# Patient Record
Sex: Female | Born: 1960
Health system: Southern US, Community
[De-identification: ages and names within clinical notes are randomized; demographics above are authoritative.]

## PROBLEM LIST (undated history)

## (undated) DIAGNOSIS — J449 Chronic obstructive pulmonary disease, unspecified: Secondary | ICD-10-CM

---

## 2021-06-23 ENCOUNTER — Inpatient Hospital Stay
Admission: EM | Admit: 2021-06-23 | Discharge: 2021-06-28 | DRG: 190 | Disposition: A | Discharge status: Home / Self Care | Payer: BC Managed Care – PPO | Attending: Internal Medicine | Admitting: Internal Medicine

## 2021-06-23 ENCOUNTER — Emergency Department: Payer: BC Managed Care – PPO

## 2021-06-23 ENCOUNTER — Other Ambulatory Visit: Payer: Self-pay

## 2021-06-23 DIAGNOSIS — Z20828 Contact with and (suspected) exposure to other viral communicable diseases: Secondary | ICD-10-CM | POA: Diagnosis present

## 2021-06-23 DIAGNOSIS — R739 Hyperglycemia, unspecified: Secondary | ICD-10-CM | POA: Diagnosis present

## 2021-06-23 DIAGNOSIS — J440 Chronic obstructive pulmonary disease with acute lower respiratory infection: Secondary | ICD-10-CM | POA: Diagnosis not present

## 2021-06-23 DIAGNOSIS — R778 Other specified abnormalities of plasma proteins: Secondary | ICD-10-CM | POA: Diagnosis present

## 2021-06-23 DIAGNOSIS — I248 Other forms of acute ischemic heart disease: Secondary | ICD-10-CM | POA: Diagnosis present

## 2021-06-23 DIAGNOSIS — J9601 Acute respiratory failure with hypoxia: Secondary | ICD-10-CM | POA: Diagnosis present

## 2021-06-23 DIAGNOSIS — Z87891 Personal history of nicotine dependence: Secondary | ICD-10-CM | POA: Diagnosis not present

## 2021-06-23 DIAGNOSIS — J439 Emphysema, unspecified: Principal | ICD-10-CM | POA: Diagnosis present

## 2021-06-23 DIAGNOSIS — Z20822 Contact with and (suspected) exposure to covid-19: Secondary | ICD-10-CM | POA: Diagnosis present

## 2021-06-23 DIAGNOSIS — R7301 Impaired fasting glucose: Secondary | ICD-10-CM

## 2021-06-23 DIAGNOSIS — J209 Acute bronchitis, unspecified: Secondary | ICD-10-CM | POA: Diagnosis present

## 2021-06-23 DIAGNOSIS — E785 Hyperlipidemia, unspecified: Secondary | ICD-10-CM | POA: Diagnosis present

## 2021-06-23 DIAGNOSIS — J9621 Acute and chronic respiratory failure with hypoxia: Secondary | ICD-10-CM | POA: Diagnosis present

## 2021-06-23 DIAGNOSIS — R7989 Other specified abnormal findings of blood chemistry: Secondary | ICD-10-CM | POA: Diagnosis present

## 2021-06-23 HISTORY — DX: Chronic obstructive pulmonary disease, unspecified: J44.9

## 2021-06-23 LAB — COMPREHENSIVE METABOLIC PANEL
ALT: 21 U/L (ref 0–44)
AST: 28 U/L (ref 15–41)
Albumin: 4.2 g/dL (ref 3.5–5.0)
Alkaline Phosphatase: 64 U/L (ref 38–126)
Anion gap: 10 (ref 5–15)
BUN: 14 mg/dL (ref 6–20)
CO2: 22 mmol/L (ref 22–32)
Calcium: 8.7 mg/dL — ABNORMAL LOW (ref 8.9–10.3)
Chloride: 106 mmol/L (ref 98–111)
Creatinine, Ser: 0.79 mg/dL (ref 0.44–1.00)
GFR, Estimated: >60 mL/min (ref >60)
Glucose, Bld: 246 mg/dL — ABNORMAL HIGH (ref 70–99)
Potassium: 4 mmol/L (ref 3.5–5.1)
Sodium: 138 mmol/L (ref 135–145)
Total Bilirubin: 0.7 mg/dL (ref 0.3–1.2)
Total Protein: 7.9 g/dL (ref 6.5–8.1)

## 2021-06-23 LAB — CBC WITH DIFFERENTIAL/PLATELET
Abs Immature Granulocytes: 0.05 10*3/uL (ref 0.00–0.07)
Basophils Absolute: 0.1 10*3/uL (ref 0.0–0.1)
Basophils Relative: 1 %
Eosinophils Absolute: 0.1 10*3/uL (ref 0.0–0.5)
Eosinophils Relative: 0 %
HCT: 43.6 % (ref 36.0–46.0)
Hemoglobin: 14.4 g/dL (ref 12.0–15.0)
Immature Granulocytes: 0 %
Lymphocytes Relative: 17 %
Lymphs Abs: 2.6 10*3/uL (ref 0.7–4.0)
MCH: 28.9 pg (ref 26.0–34.0)
MCHC: 33 g/dL (ref 30.0–36.0)
MCV: 87.6 fL (ref 80.0–100.0)
Monocytes Absolute: 0.9 10*3/uL (ref 0.1–1.0)
Monocytes Relative: 6 %
Neutro Abs: 11.7 10*3/uL — ABNORMAL HIGH (ref 1.7–7.7)
Neutrophils Relative %: 76 %
Platelets: 214 10*3/uL (ref 150–400)
RBC: 4.98 MIL/uL (ref 3.87–5.11)
RDW: 12.6 % (ref 11.5–15.5)
WBC: 15.3 10*3/uL — ABNORMAL HIGH (ref 4.0–10.5)
nRBC: 0 % (ref 0.0–0.2)

## 2021-06-23 LAB — RESP PANEL BY RT-PCR (FLU A&B, COVID) ARPGX2
Influenza A by PCR: NEGATIVE
Influenza B by PCR: NEGATIVE
SARS Coronavirus 2 by RT PCR: NEGATIVE

## 2021-06-23 LAB — TROPONIN I (HIGH SENSITIVITY)
Troponin I (High Sensitivity): 20 ng/L — ABNORMAL HIGH (ref <18)
Troponin I (High Sensitivity): 32 ng/L — ABNORMAL HIGH (ref <18)

## 2021-06-23 LAB — PROCALCITONIN: Procalcitonin: <0.1 ng/mL

## 2021-06-23 LAB — BLOOD GAS, VENOUS
Acid-base deficit: 3.2 mmol/L — ABNORMAL HIGH (ref 0.0–2.0)
Bicarbonate: 21.3 mmol/L (ref 20.0–28.0)
O2 Saturation: 96.3 %
Patient temperature: 37
pCO2, Ven: 36 mmHg — ABNORMAL LOW (ref 44.0–60.0)
pH, Ven: 7.38 (ref 7.250–7.430)
pO2, Ven: 86 mmHg — ABNORMAL HIGH (ref 32.0–45.0)

## 2021-06-23 LAB — LACTIC ACID, PLASMA: Lactic Acid, Venous: 1.7 mmol/L (ref 0.5–1.9)

## 2021-06-23 LAB — D-DIMER, QUANTITATIVE: D-Dimer, Quant: 1.18 ug/mL-FEU — ABNORMAL HIGH (ref 0.00–0.50)

## 2021-06-23 IMAGING — DX DG CHEST 1V PORT
1 series · 1 of 1 positions shown · non-contrast
Comparison: None.

CLINICAL DATA: Rule out pneumonia. Shortness of breath. Positive
flu test.

EXAM:
PORTABLE CHEST 1 VIEW

[chest ap]
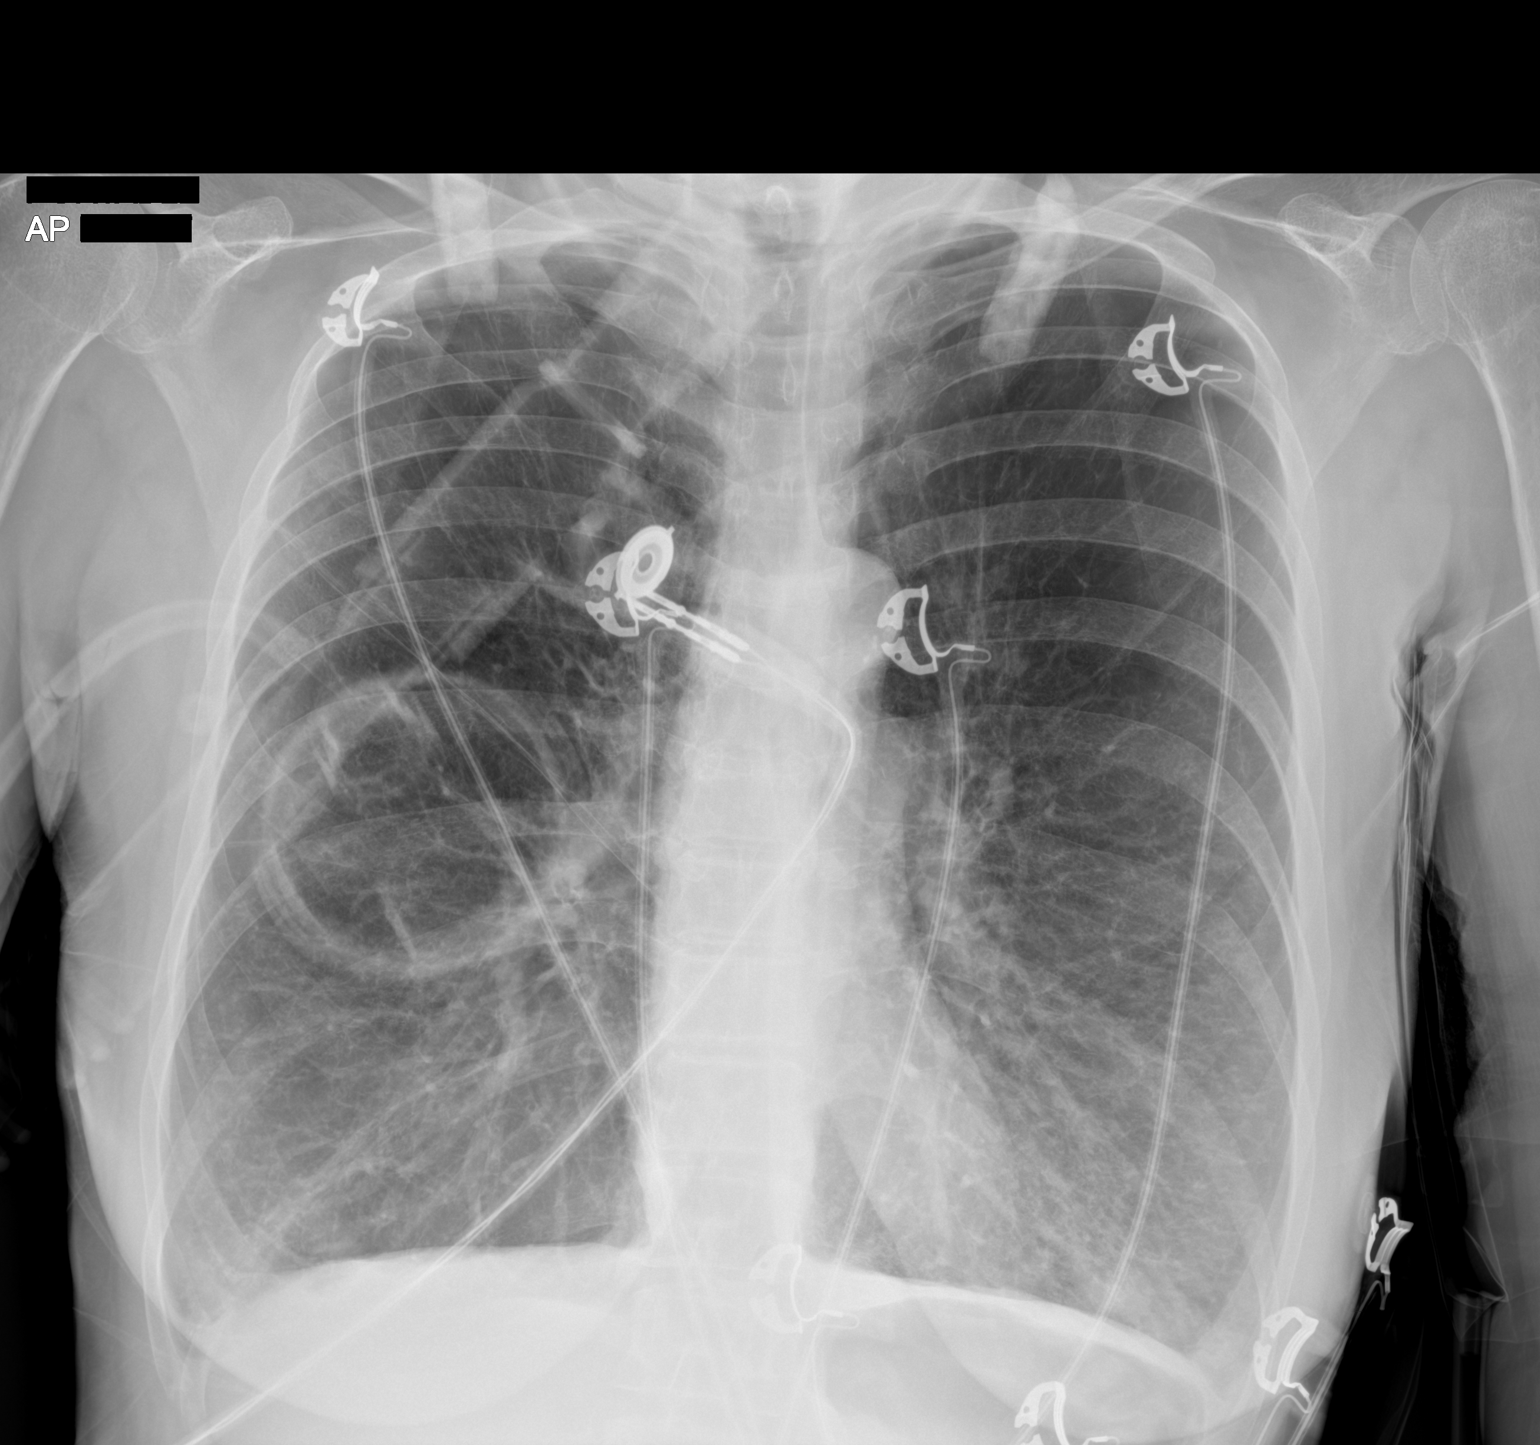

[1 of 1 positions shown; findings below may reference images not displayed]

FINDINGS: Emphysematous changes in the lungs with hyperinflation. Mild opacity
in the bases, left greater than right. No nodule or mass. The
cardiomediastinal silhouette is normal.
IMPRESSION: 1. Emphysema.
2. Bibasilar opacities are partially explained by redistribution of
vasculature. However, mild infiltrate is suspected in the left
greater than right base. Recommend short-term follow-up to ensure
resolution.

## 2021-06-23 MED ORDER — IPRATROPIUM-ALBUTEROL 0.5-2.5 (3) MG/3ML IN SOLN
3.0000 mL | Freq: Four times a day (QID) | RESPIRATORY_TRACT | Status: DC
  Administered 2021-06-24 – 2021-06-28 (×17): 3 mL via RESPIRATORY_TRACT
  Filled 2021-06-23 (×18): qty 3

## 2021-06-23 MED ORDER — SODIUM CHLORIDE 0.9 % IV SOLN
1.0000 g | Freq: Once | INTRAVENOUS | Status: AC
  Administered 2021-06-23: 1 g via INTRAVENOUS
  Filled 2021-06-23: qty 10

## 2021-06-23 MED ORDER — ENOXAPARIN SODIUM 40 MG/0.4ML IJ SOSY: 40.0000 mg | PREFILLED_SYRINGE | INTRAMUSCULAR | Status: DC

## 2021-06-23 MED ORDER — ALBUTEROL SULFATE (2.5 MG/3ML) 0.083% IN NEBU
2.5000 mg | INHALATION_SOLUTION | Freq: Once | RESPIRATORY_TRACT | Status: AC
  Administered 2021-06-23: 2.5 mg via RESPIRATORY_TRACT
  Filled 2021-06-23: qty 3

## 2021-06-23 MED ORDER — METHYLPREDNISOLONE SODIUM SUCC 40 MG IJ SOLR
40.0000 mg | Freq: Two times a day (BID) | INTRAMUSCULAR | Status: DC
  Administered 2021-06-24: 40 mg via INTRAVENOUS
  Filled 2021-06-23: qty 1

## 2021-06-23 MED ORDER — ALBUTEROL SULFATE (2.5 MG/3ML) 0.083% IN NEBU
2.5000 mg | INHALATION_SOLUTION | RESPIRATORY_TRACT | Status: DC | PRN
  Administered 2021-06-25: 2.5 mg via RESPIRATORY_TRACT
  Filled 2021-06-23: qty 3

## 2021-06-23 MED ORDER — SODIUM CHLORIDE 0.9 % IV SOLN: 500.0000 mg | INTRAVENOUS | Status: DC

## 2021-06-23 MED ORDER — SODIUM CHLORIDE 0.9 % IV BOLUS
1000.0000 mL | Freq: Once | INTRAVENOUS | Status: AC
  Administered 2021-06-23: 1000 mL via INTRAVENOUS

## 2021-06-23 MED ORDER — SODIUM CHLORIDE 0.9 % IV SOLN
500.0000 mg | Freq: Once | INTRAVENOUS | Status: AC
  Administered 2021-06-23: 500 mg via INTRAVENOUS
  Filled 2021-06-23: qty 500

## 2021-06-23 MED ORDER — PREDNISONE 20 MG PO TABS: 40.0000 mg | ORAL_TABLET | Freq: Every day | ORAL | Status: DC

## 2021-06-23 MED ORDER — ALBUTEROL SULFATE (2.5 MG/3ML) 0.083% IN NEBU
2.5000 mg | INHALATION_SOLUTION | Freq: Once | RESPIRATORY_TRACT | Status: AC
  Administered 2021-06-24: 2.5 mg via RESPIRATORY_TRACT
  Filled 2021-06-23: qty 3

## 2021-06-23 NOTE — H&P (Addendum)
History and Physical    Gina Bates KGM:[id number] DOB: 10/29/1960 DOA: 06/23/2021  PCP: Laqueta Carina, MD   Patient coming from: home  I have personally briefly reviewed patient's relevant medical records in [facility]  Chief Complaint: shortness of breath  HPI: Gina Bates is a 60 y.o. female with medical history significant for COPD, not on home oxygen with flulike symptoms x4 days with cough productive of yellow sputum and subjective fever, following exposure to grandson who is flu positive who presents to the ED with sudden onset shortness of breath that started 7 hours prior to presentation.  Patient had been taking Tamiflu for the past 4 days prescribed by her PCP based on his symptoms and her grandsons positive influenza test.  She denies nausea, vomiting or chills.  Denies lower extremity pain or swelling and denies chest pain.  EMS reported O2 sats in the 80s on room air and she was transported on CPAP.  She received duo nebs and Solu-Medrol in route.  ED course: On arrival, tachypneic at 36 and tachycardic at 126.  She was transitioned from CPAP to BiPAP and eventually to HFNC at 5 L saturating in the low low to mid 90s Blood work: VBG with pH 7.38, PCO2 36 WBC 15,000, lactic acid 1.7, procalcitonin less than 0.1 Troponin 20-32.  D-dimer 1.18.  Blood sugar 246.    EKG, personally viewed and interpreted: Sinus tachycardia at 136.  Nonspecific ST-T wave changes  Chest x-ray: Emphysema: Mild infiltrate suspected in left greater than right base  CTA chest: Pending  Patient treated with Rocephin and vancomycin.  Hospitalist consulted for admission    Review of Systems: As per HPI otherwise all other systems on review of systems negative.   Past Medical History:  Diagnosis Date   COPD (chronic obstructive pulmonary disease) (HCC)     History reviewed. No pertinent surgical history.   reports that she has quit smoking. Her smoking use included cigarettes.  She has never used smokeless tobacco. She reports that she does not currently use alcohol. No history on file for drug use.  No Known Allergies  History reviewed. No pertinent family history.    Prior to Admission medications   Not on File    Physical Exam: Vitals:   06/23/21 1752 06/23/21 1754 06/23/21 2047  BP: (!) 119/93  130/90  Pulse: (!) 136 (!) 136 (!) 120  Resp: (!) 26 (!) 36 (!) 31  Temp: (!) 97.3 F (36.3 C)    TempSrc: Axillary    SpO2: 96% 95% 95%   Constitutional: Alert and oriented x 3 .  In moderate respiratory distress on BiPAP.  Tripoding.  Speaking in short sentences HEENT:      Head: Normocephalic and atraumatic.         Eyes: PERLA, EOMI, Conjunctivae are normal. Sclera is non-icteric.       Mouth/Throat: Mucous membranes are moist.       Neck: Supple with no signs of meningismus. Cardiovascular: Regular rate and rhythm. No murmurs, gallops, or rubs. 2+ symmetrical distal pulses are present . No JVD. No  LE edema Respiratory: Respiratory effort increased.Lungs sounds diminished bilaterally. No wheezes, crackles, or rhonchi.  Gastrointestinal: Soft, non tender, non distended. Positive bowel sounds.  Genitourinary: No CVA tenderness. Musculoskeletal: Nontender with normal range of motion in all extremities. No cyanosis, or erythema of extremities. Neurologic:  Face is symmetric. Moving all extremities. No gross focal neurologic deficits . Skin: Skin is warm, dry.  No rash  or ulcers Psychiatric: Mood and affect are appropriate    Labs on Admission: I have personally reviewed following labs and imaging studies  CBC: Recent Labs  Lab 06/23/21 1758  WBC 15.3*  NEUTROABS 11.7*  HGB 14.4  HCT 43.6  MCV 87.6  PLT 214   Basic Metabolic Panel: Recent Labs  Lab 06/23/21 1758  NA 138  K 4.0  CL 106  CO2 22  GLUCOSE 246*  BUN 14  CREATININE 0.79  CALCIUM 8.7*   GFR: CrCl cannot be calculated (Unknown ideal weight.). Liver Function Tests: Recent  Labs  Lab 06/23/21 1758  AST 28  ALT 21  ALKPHOS 64  BILITOT 0.7  PROT 7.9  ALBUMIN 4.2   No results for input(s): LIPASE, AMYLASE in the last 168 hours. No results for input(s): AMMONIA in the last 168 hours. Coagulation Profile: No results for input(s): INR, PROTIME in the last 168 hours. Cardiac Enzymes: No results for input(s): CKTOTAL, CKMB, CKMBINDEX, TROPONINI in the last 168 hours. BNP (last 3 results) No results for input(s): PROBNP in the last 8760 hours. HbA1C: No results for input(s): HGBA1C in the last 72 hours. CBG: No results for input(s): GLUCAP in the last 168 hours. Lipid Profile: No results for input(s): CHOL, HDL, LDLCALC, TRIG, CHOLHDL, LDLDIRECT in the last 72 hours. Thyroid Function Tests: No results for input(s): TSH, T4TOTAL, FREET4, T3FREE, THYROIDAB in the last 72 hours. Anemia Panel: No results for input(s): VITAMINB12, FOLATE, FERRITIN, TIBC, IRON, RETICCTPCT in the last 72 hours. Urine analysis: No results found for: COLORURINE, APPEARANCEUR, LABSPEC, PHURINE, GLUCOSEU, HGBUR, BILIRUBINUR, KETONESUR, PROTEINUR, UROBILINOGEN, NITRITE, LEUKOCYTESUR  Radiological Exams on Admission: DG Chest Portable 1 View  Result Date: 06/23/2021 CLINICAL DATA:  Rule out pneumonia. Shortness of breath. Positive flu test. EXAM: PORTABLE CHEST 1 VIEW COMPARISON:  None. FINDINGS: Emphysematous changes in the lungs with hyperinflation. Mild opacity in the bases, left greater than right. No nodule or mass. The cardiomediastinal silhouette is normal. IMPRESSION: 1. Emphysema. 2. Bibasilar opacities are partially explained by redistribution of vasculature. However, mild infiltrate is suspected in the left greater than right base. Recommend short-term follow-up to ensure resolution. Electronically Signed   By: Gerome Sam III M.D.   On: 06/23/2021 18:20    Assessment/Plan    Acute respiratory failure with hypoxia  -Patient in moderate respiratory distress, tachypneic,  tripoding, speaking in 2 word sentences, requiring BiPAP - Secondary to viral respiratory tract infection, acute on chronic bronchitis, possible PE given elevated D-dimer, - Continue BiPAP - Follow-up CTA chest (patient unable to tolerate lying flat so we will give one-time treatment dose of Lovenox until able to get CTA chest) - Treat potential etiologies    COPD with acute bronchitis (HCC) - Continue azithromycin - Ativan as needed-bronchodilators - Continue IV steroids  Viral respiratory tract infection/? - COVID and flu negative - We will continue Tamiflu to completion given history of exposure -droplet precautions    Elevated troponin - Suspect demand ischemia with troponin 20-32, without chest pain or ischemic EKG changes    Hyperglycemia - Blood glucose 246 without history of diabetes - Hemoglobin A1c blood sugar monitoring given IV steroids    DVT prophylaxis: Lovenox  Code Status: full code  Family Communication:  son at bedside Disposition Plan: Back to previous home environment Consults called: none  Status:At the time of admission, it appears that the appropriate admission status for this patient is INPATIENT. This is judged to be reasonable and necessary in order to provide  the required intensity of service to ensure the patient's safety given the presenting symptoms, physical exam findings, and initial radiographic and laboratory data in the context of their  Comorbid conditions.   Patient requires inpatient status due to high intensity of service, high risk for further deterioration and high frequency of surveillance required.   I certify that at the point of admission it is my clinical judgment that the patient will require inpatient hospital care spanning beyond 2 midnights    Andris Baumann MD [facility]   06/23/2021, 10:43 PM

## 2021-06-23 NOTE — ED Notes (Signed)
Report given to Melissa RN

## 2021-06-23 NOTE — ED Provider Notes (Addendum)
[facility]  ____________________________________________   Event Date/Time   First MD Initiated Contact with Patient 06/23/21 1754     (approximate)  I have reviewed the triage vital signs and the nursing notes.   HISTORY  Chief Complaint Shortness of Breath and Influenza    HPI Gina Bates is a 60 y.o. female with past medical history of COPD not on home oxygen and hyperlipidemia who presents with shortness of breath.  Patient tells me that about 4 days ago she started having flulike symptoms including cough productive of yellow sputum subjective fevers.  Then today her breathing started to get bad.  Her grandson had influenza and she had a telehealth visit and was prescribed Tamiflu but did not actually have a positive flu test.  Patient denies chest pain nausea vomiting fevers or chills.  No history of blood clots.  Denies lower extremity edema.  Has never been on BiPAP before.         Past Medical History:  Diagnosis Date   COPD (chronic obstructive pulmonary disease) [facility])     Patient Active Problem List   Diagnosis Date Noted   Acute respiratory failure with hypoxia (HCC) 06/23/2021   COPD with acute bronchitis (HCC) 06/23/2021   Hyperglycemia 06/23/2021   Elevated troponin 06/23/2021    Prior to Admission medications   Not on File    Allergies Patient has no allergy information on record.  History reviewed. No pertinent family history.  Social History Social History   Tobacco Use   Smoking status: Former    Types: Cigarettes   Smokeless tobacco: Never  Substance Use Topics   Alcohol use: Not Currently    Review of Systems   Review of Systems  Constitutional:  Positive for fever. Negative for chills.  Respiratory:  Positive for shortness of breath.   Cardiovascular:  Negative for chest pain and leg swelling.  Gastrointestinal:  Negative for abdominal pain, nausea and vomiting.  All other systems reviewed and are  negative.  Physical Exam Updated Vital Signs BP 130/90 (BP Location: Right Arm)   Pulse (!) 120   Temp (!) 97.3 F (36.3 C) (Axillary)   Resp (!) 31   SpO2 95%   Physical Exam Vitals and nursing note reviewed.  Constitutional:      General: She is in acute distress.     Appearance: Normal appearance.  HENT:     Head: Normocephalic and atraumatic.  Eyes:     General: No scleral icterus.    Conjunctiva/sclera: Conjunctivae normal.  Cardiovascular:     Rate and Rhythm: Regular rhythm. Tachycardia present.  Pulmonary:     Effort: Respiratory distress present.     Breath sounds: No stridor.     Comments: Patient has respiratory distress, using accessory muscles, decreased air movement throughout no obvious wheezing Is able to speak in short sentences Abdominal:     Palpations: Abdomen is soft.     Tenderness: There is no abdominal tenderness. There is no guarding.  Musculoskeletal:        General: No deformity or signs of injury.     Cervical back: Normal range of motion.     Right lower leg: No edema.     Left lower leg: No edema.  Skin:    General: Skin is dry.     Coloration: Skin is not jaundiced or pale.  Neurological:     General: No focal deficit present.     Mental Status: She is alert and oriented  to person, place, and time. Mental status is at baseline.  Psychiatric:        Mood and Affect: Mood normal.        Behavior: Behavior normal.     LABS (all labs ordered are listed, but only abnormal results are displayed)  Labs Reviewed  COMPREHENSIVE METABOLIC PANEL - Abnormal; Notable for the following components:      Result Value   Glucose, Bld 246 (*)    Calcium 8.7 (*)    All other components within normal limits  CBC WITH DIFFERENTIAL/PLATELET - Abnormal; Notable for the following components:   WBC 15.3 (*)    Neutro Abs 11.7 (*)    All other components within normal limits  BLOOD GAS, VENOUS - Abnormal; Notable for the following components:   pCO2,  Ven 36 (*)    pO2, Ven 86.0 (*)    Acid-base deficit 3.2 (*)    All other components within normal limits  D-DIMER, QUANTITATIVE - Abnormal; Notable for the following components:   D-Dimer, Quant 1.18 (*)    All other components within normal limits  TROPONIN I (HIGH SENSITIVITY) - Abnormal; Notable for the following components:   Troponin I (High Sensitivity) 20 (*)    All other components within normal limits  TROPONIN I (HIGH SENSITIVITY) - Abnormal; Notable for the following components:   Troponin I (High Sensitivity) 32 (*)    All other components within normal limits  RESP PANEL BY RT-PCR (FLU A&B, COVID) ARPGX2  CULTURE, BLOOD (ROUTINE X 2)  CULTURE, BLOOD (ROUTINE X 2)  LACTIC ACID, PLASMA  PROCALCITONIN  LACTIC ACID, PLASMA  HIV ANTIBODY (ROUTINE TESTING W REFLEX)  CBC  CREATININE, SERUM  HEMOGLOBIN A1C   ____________________________________________  EKG  Sinus tachycardia, borderline right axis deviation normal intervals, no acute ischemic changes ____________________________________________  RADIOLOGY Ky Barban, personally viewed and evaluated these images (plain radiographs) as part of my medical decision making, as well as reviewing the written report by the radiologist.  ED MD interpretation: The chest x-ray which shows patchy interstitial markings throughout the bilateral lower lobes    ____________________________________________   PROCEDURES  Procedure(s) performed (including Critical Care):  .Critical Care Performed by: Georga Hacking, MD Authorized by: Georga Hacking, MD   Critical care provider statement:    Critical care time (minutes):  75   Critical care was time spent personally by me on the following activities:  Development of treatment plan with patient or surrogate, discussions with consultants, evaluation of patient's response to treatment, examination of patient, ordering and review of laboratory studies, ordering and  review of radiographic studies, ordering and performing treatments and interventions, pulse oximetry, re-evaluation of patient's condition and review of old charts   ____________________________________________   INITIAL IMPRESSION / ASSESSMENT AND PLAN / ED COURSE   Patient is a 60 year old female with COPD and recent viral syndrome who presents with shortness of breath.  Per EMS she was initially hypoxic in the 80s and was placed on CPAP.  On ED arrival she is transition to BiPAP immediately.  Saturating in the mid 90s and tachycardic.  She does have significant increased work of breathing, she does not have obvious wheezing but just poor air movement.  She was given Solu-Medrol with EMS, will continue to treat with albuterol nebs.  Patient's chest x-ray does show some increased interstitial markings, radiology does comment that there may be a left lower lobe infiltrate.  She does have a leukocytosis of 15 with  11.7% neutrophils.  With her clinical symptoms we will treat her.  We will also send Procal lactate and blood cultures.  Patient's VBG is rather normal, PCO2 if anything is decreased which is not really consistent with significant COPD exacerbation.  On repeat assessment patient's work of breathing is improved and she feels better however she is still only saturating 93% on the BiPAP.  Together with her overall reassuring VBG I am concerned that her hypoxia and tachycardia somewhat out of proportion to her COPD.  We will obtain a D-dimer, positive pursue CT angio.  Patient will require admission given her new oxygen requirement.   D-dimer is positive.  CT angio was ordered.  Discussed the patient with the hospitalist who admit her.  Of note patient was transferred to high flow and was doing well however started to have increased work of breathing so was put back on BiPAP.  During this time oxygenation remained normal.  She again does not have any  wheezing.  ____________________________________________   FINAL CLINICAL IMPRESSION(S) / ED DIAGNOSES  Final diagnoses:  Acute respiratory failure with hypoxia [facility])     ED Discharge Orders     None        Note:  This document was prepared using Dragon voice recognition software and may include unintentional dictation errors.    Georga Hacking, MD 06/23/21 2337    Georga Hacking, MD 06/24/21 [phone]

## 2021-06-23 NOTE — ED Triage Notes (Signed)
Pt comes via EMs with c/o SOB and Flu+. EMS states this started yesterday. EMS reports pt was in 80s for O2 upon their arrival.  Pt has hx of COPD. Pt given 125 solu medrol, 1 duoneb and 2 albuterol.  Pt arrives in tripod position with cpap on. MD at bedside. 20 left hand

## 2021-06-23 NOTE — ED Notes (Signed)
Patient report some relief of shortness of breath with first neb treatment.

## 2021-06-24 ENCOUNTER — Encounter: Payer: Self-pay | Admitting: Internal Medicine

## 2021-06-24 DIAGNOSIS — J9601 Acute respiratory failure with hypoxia: Secondary | ICD-10-CM | POA: Diagnosis not present

## 2021-06-24 LAB — HIV ANTIBODY (ROUTINE TESTING W REFLEX): HIV Screen 4th Generation wRfx: NONREACTIVE

## 2021-06-24 LAB — HEMOGLOBIN A1C
Hgb A1c MFr Bld: 5.8 % — ABNORMAL HIGH (ref 4.8–5.6)
Mean Plasma Glucose: 120 mg/dL

## 2021-06-24 LAB — LACTIC ACID, PLASMA: Lactic Acid, Venous: 1.3 mmol/L (ref 0.5–1.9)

## 2021-06-24 MED ORDER — OSELTAMIVIR PHOSPHATE 75 MG PO CAPS
75.0000 mg | ORAL_CAPSULE | Freq: Two times a day (BID) | ORAL | Status: AC
  Administered 2021-06-24 (×2): 75 mg via ORAL
  Filled 2021-06-24 (×3): qty 1

## 2021-06-24 MED ORDER — AZITHROMYCIN 250 MG PO TABS
500.0000 mg | ORAL_TABLET | Freq: Every day | ORAL | Status: AC
  Administered 2021-06-24 – 2021-06-27 (×4): 500 mg via ORAL
  Filled 2021-06-24 (×2): qty 2
  Filled 2021-06-24: qty 1
  Filled 2021-06-24: qty 2

## 2021-06-24 MED ORDER — ENOXAPARIN SODIUM 30 MG/0.3ML IJ SOSY
30.0000 mg | PREFILLED_SYRINGE | INTRAMUSCULAR | Status: DC
  Administered 2021-06-24 – 2021-06-25 (×2): 30 mg via SUBCUTANEOUS
  Filled 2021-06-24 (×2): qty 0.3

## 2021-06-24 MED ORDER — ENOXAPARIN SODIUM 60 MG/0.6ML IJ SOSY
1.0000 mg/kg | PREFILLED_SYRINGE | Freq: Once | INTRAMUSCULAR | Status: AC
  Administered 2021-06-24: 47.5 mg via SUBCUTANEOUS
  Filled 2021-06-24: qty 0.6

## 2021-06-24 MED ORDER — METHYLPREDNISOLONE SODIUM SUCC 125 MG IJ SOLR
80.0000 mg | INTRAMUSCULAR | Status: DC
  Administered 2021-06-24 – 2021-06-27 (×4): 80 mg via INTRAVENOUS
  Filled 2021-06-24 (×4): qty 2

## 2021-06-24 MED ORDER — ADULT MULTIVITAMIN W/MINERALS CH
1.0000 | ORAL_TABLET | Freq: Every day | ORAL | Status: DC
  Administered 2021-06-25 – 2021-06-28 (×4): 1 via ORAL
  Filled 2021-06-24 (×4): qty 1

## 2021-06-24 MED ORDER — ENSURE ENLIVE PO LIQD
237.0000 mL | Freq: Two times a day (BID) | ORAL | Status: DC
  Administered 2021-06-27 (×2): 237 mL via ORAL

## 2021-06-24 MED ORDER — SODIUM CHLORIDE 0.9 % IV SOLN: 500.0000 mg | INTRAVENOUS | Status: DC

## 2021-06-24 MED ORDER — OSELTAMIVIR PHOSPHATE 6 MG/ML PO SUSR
75.0000 mg | Freq: Two times a day (BID) | ORAL | Status: DC
  Filled 2021-06-24 (×2): qty 12.5

## 2021-06-24 NOTE — ED Notes (Signed)
Per pt. Request, pt. Taken off bipap, and placed back on HFNC, 5L O2. Pt. Is able to speak in short sentences, denies SOB, RR is WNL. Pt. Verbalizes being much more relaxed and able to breathe much easier.

## 2021-06-24 NOTE — ED Notes (Addendum)
PT at bedside. Pt ambulating at bedside. O2 sat maintains 92% during activity, while on 5L high flow O2. Pt. States she is winded during activity. Dr. Allena Katz notified.

## 2021-06-24 NOTE — ED Notes (Signed)
This RN to bedside. Pt sitting up, tripod posturing, dyspnea at rest. Updated family and pt. On POC. Resp. Paged to put pt. Back on bipap.

## 2021-06-24 NOTE — Progress Notes (Signed)
OT Cancellation Note  Patient Details Name: Gina Bates MRN: [id number] DOB: [birth year]   Cancelled Treatment:    Reason Eval/Treat Not Completed: OT screened, no needs identified, will sign off. Order received, chart reviewed. Pt reports being near baseline functional independence, limited by O2 only. Pt and family instructed on ECS. No skilled acute OT needs identified. Will sign off. Please re-consult if additional needs arise.   Kathie Dike, M.S. OTR/L  06/24/21, 2:19 PM  ascom [phone]

## 2021-06-24 NOTE — Evaluation (Signed)
Physical Therapy Evaluation Patient Details Name: Gina Bates MRN: [id number] DOB: 21-Apr-1961 Today's Date: 06/24/2021  History of Present Illness  Gina Bates is a 60 y.o. female with medical history significant for COPD, not on home oxygen with flulike symptoms x4 days with cough productive of yellow sputum and subjective fever, following exposure to grandson who is flu positive who presents to the ED with sudden onset shortness of breath that started 7 hours prior to presentation.    Clinical Impression  Pt received getting breathing treatment from nursing staff upon arrival to the room.  Son answered home set-up questions during breathing treatment.  Pt currently lives at home with husband and was independent with all tasks at home prior to hospitalization.  Pt's son lives ~4 minutes from mother and is able to assist pt as well.  Pt able to perform bed mobility and transfers independently, however due to lines/leads needs assistance with ambulation around the bed.  Limited walking area due to HFNC being utilized.  Pt moves well with good oxygen saturation (>94% throughout treatment).  Pt would benefit from therapy during hospital stay for improved gait mechanics and assistance with breathing patterns and pulmonary challenge to return to PLOF and improve QoL.     Recommendations for follow up therapy are one component of a multi-disciplinary discharge planning process, led by the attending physician.  Recommendations may be updated based on patient status, additional functional criteria and insurance authorization.  Follow Up Recommendations No PT follow up    Assistance Recommended at Discharge Intermittent Supervision/Assistance  Functional Status Assessment Patient has had a recent decline in their functional status and demonstrates the ability to make significant improvements in function in a reasonable and predictable amount of time.  Equipment Recommendations  None recommended by  PT    Recommendations for Other Services       Precautions / Restrictions Precautions Precautions: None Restrictions Weight Bearing Restrictions: No      Mobility  Bed Mobility Overal bed mobility: Independent                  Transfers Overall transfer level: Modified independent Equipment used: None                    Ambulation/Gait Ambulation/Gait assistance: Supervision Gait Distance (Feet): 15 Feet Assistive device: None Gait Pattern/deviations: WFL(Within Functional Limits) Gait velocity: decreased     General Gait Details: Difficulty with lines/leads in ED and only able to travel as far as HFNC would allow.  Stairs            Wheelchair Mobility    Modified Rankin (Stroke Patients Only)       Balance Overall balance assessment: Modified Independent                                           Pertinent Vitals/Pain Pain Assessment: No/denies pain    Home Living Family/patient expects to be discharged to:: Private residence Living Arrangements: Spouse/significant other Available Help at Discharge: Family;Available 24 hours/day Type of Home: House Home Access: Stairs to enter Entrance Stairs-Rails: Can reach both Entrance Stairs-Number of Steps: 4 Alternate Level Stairs-Number of Steps: 13 Home Layout: Two level;1/2 bath on main level;Bed/bath upstairs Home Equipment: None      Prior Function Prior Level of Function : Independent/Modified Independent  Hand Dominance   Dominant Hand: Right    Extremity/Trunk Assessment                Communication   Communication: No difficulties  Cognition Arousal/Alertness: Awake/alert Behavior During Therapy: WFL for tasks assessed/performed Overall Cognitive Status: Within Functional Limits for tasks assessed                                          General Comments      Exercises Other Exercises Other  Exercises: Pt and son educated on role of Pt and services provided during hospital staff.  Pt encouraged to perform exercises and mobility during hospital stay to prevent muscle atrophy.   Assessment/Plan    PT Assessment Patient needs continued PT services  PT Problem List Decreased strength;Decreased balance       PT Treatment Interventions Gait training;Functional mobility training;Therapeutic activities;Therapeutic exercise;Balance training    PT Goals (Current goals can be found in the Care Plan section)  Acute Rehab PT Goals Patient Stated Goal: to go home. PT Goal Formulation: With patient/family Time For Goal Achievement: 07/08/21 Potential to Achieve Goals: Good    Frequency Min 2X/week   Barriers to discharge        Co-evaluation               AM-PAC PT "6 Clicks" Mobility  Outcome Measure Help needed turning from your back to your side while in a flat bed without using bedrails?: None Help needed moving from lying on your back to sitting on the side of a flat bed without using bedrails?: None Help needed moving to and from a bed to a chair (including a wheelchair)?: None Help needed standing up from a chair using your arms (e.g., wheelchair or bedside chair)?: None Help needed to walk in hospital room?: A Little Help needed climbing 3-5 steps with a railing? : A Little 6 Click Score: 22    End of Session Equipment Utilized During Treatment: Gait belt Activity Tolerance: Patient tolerated treatment well Patient left: in bed;with call bell/phone within reach;with nursing/sitter in room;with family/visitor present Nurse Communication: Mobility status PT Visit Diagnosis: Unsteadiness on feet (R26.81);Muscle weakness (generalized) (M62.81)    Time: 1224-4975 PT Time Calculation (min) (ACUTE ONLY): 20 min   Charges:   PT Evaluation $PT Eval Low Complexity: 1 Low PT Treatments $Therapeutic Activity: 8-22 mins        Nolon Bussing, PT, DPT 06/24/21,  12:20 PM   Phineas Real 06/24/2021, 12:16 PM

## 2021-06-24 NOTE — ED Notes (Signed)
RN attempted to get pt to lay down in bed to see if she could tolerate CT.  PT refused and became very nervous.

## 2021-06-24 NOTE — Progress Notes (Signed)
Initial Nutrition Assessment  DOCUMENTATION CODES:   Not applicable  INTERVENTION:   -Ensure Enlive po BID, each supplement provides 350 kcal and 20 grams of protein  -MVI with minerals daily  NUTRITION DIAGNOSIS:   Increased nutrient needs related to chronic illness (COPD) as evidenced by estimated needs.  GOAL:   Patient will meet greater than or equal to 90% of their needs  MONITOR:   PO intake, Supplement acceptance, Labs, Weight trends, Skin, I & O's  REASON FOR ASSESSMENT:   Consult Assessment of nutrition requirement/status  ASSESSMENT:   Gina Bates is a 60 y.o. female with medical history significant for COPD, not on home oxygen with flulike symptoms x4 days with cough productive of yellow sputum and subjective fever, following exposure to grandson who is flu positive who presents to the ED with sudden onset shortness of breath that started 7 hours prior to presentation.  Patient had been taking Tamiflu for the past 4 days prescribed by her PCP based on his symptoms and her grandsons positive influenza test.  She denies nausea, vomiting or chills.  Denies lower extremity pain or swelling and denies chest pain.  EMS reported O2 sats in the 80s on room air and she was transported on CPAP.  She received duo nebs and Solu-Medrol in route.  Pt admitted with acute respiratory failure with hypoxia, COPD, and viral respiratory tract infection.    Pt unavailable at time of visit. RD unable to obtain further nutrition-related history or complete nutrition-focused physical exam at this time.    Per H&P, pt is both COVID and flu negative.   Pt is currently on a regular diet. No meal completion data currently available at this time.   No wt history available to assess at this time.   Pt with increased nutritional needs for COPD and would benefit from addition of oral nutrition supplements.   Medications reviewed and include sol-medrol.   Labs reviewed.   Diet Order:    Diet Order             Diet regular Room service appropriate? Yes; Fluid consistency: Thin  Diet effective now                   EDUCATION NEEDS:   No education needs have been identified at this time  Skin:  Skin Assessment: Reviewed RN Assessment  Last BM:  Unknown  Height:   Ht Readings from Last 1 Encounters:  06/24/21 5\' 1"  (1.549 m)    Weight:   Wt Readings from Last 1 Encounters:  06/24/21 48.5 kg    Ideal Body Weight:  47.7 kg  BMI:  Body mass index is 20.22 kg/m.  Estimated Nutritional Needs:   Kcal:  1500-1700  Protein:  75-90 grams  Fluid:  > 1.5 L    06/26/21, RD, LDN, CDCES Registered Dietitian II Certified Diabetes Care and Education Specialist Please refer to [facility] for RD and/or RD on-call/weekend/after hours pager

## 2021-06-24 NOTE — Progress Notes (Addendum)
  Progress Note    Gina Bates   YBO:[id number]  DOB: 08-22-1960  DOA: 06/23/2021     1 Date of Service: 06/24/2021   Clinical Course 11/24.  Admitted with COPD exacerbation.  Required BiPAP therapy. 11/25 off of BiPAP.  Assessment and Plan Acute respiratory failure with hypoxia, present on admission sats 80s on arrival. -Patient in moderate respiratory distress, tachypneic, tripoding, speaking in 2 word sentences, requiring BiPAP - Secondary to viral respiratory tract infection, acute on chronic bronchitis, possible PE given elevated D-dimer, -We will discontinue BiPAP.  Continue supportive oxygen therapy.  Continue steroids.  Maintain progressive care overnight. - Treat potential etiologies     COPD with acute bronchitis (HCC) - Continue azithromycin - Ativan as needed-bronchodilators - Continue IV steroids  Viral respiratory tract infection/? - COVID and flu negative - Recently had an exposure to influenza and was started on Tamiflu outpatient. Influenza is negative we will continue Tamiflu to completion given history of exposure -droplet precautions     Elevated troponin - Suspect demand ischemia with troponin 20-32, without chest pain or ischemic EKG changes     Hyperglycemia - Blood glucose 246 without history of diabetes - Hemoglobin A1c blood sugar monitoring given IV steroids   Subjective:  Continues to have shortness of breath.  No nausea no vomiting.  No chest pain.  No abdominal pain.  Was able to come off of the BiPAP.  Objective Vitals:   06/24/21 1530 06/24/21 1600 06/24/21 1630 06/24/21 1658  BP:      Pulse: (!) 112 (!) 117 (!) 115   Resp:  (!) 33 (!) 29   Temp:    98.4 F (36.9 C)  TempSrc:    Oral  SpO2: 97% 94% 95%   Weight:      Height:       48.5 kg  Tachypneic and tachycardic. Exam General: Appear in mild distress, no Rash; Oral Mucosa Clear, moist. no Abnormal Neck Mass Or lumps, Conjunctiva normal  Cardiovascular: S1 and S2 Present,  no Murmur, Respiratory: increased respiratory effort, Bilateral Air entry present and bilateral  Crackles, no wheezes Abdomen: Bowel Sound present, Soft and no tenderness Extremities: trace Pedal edema Neurology: alert and oriented to time, place, and person affect appropriate. no new focal deficit Gait not checked due to patient safety concerns    Labs / Other Information Procalcitonin negative.  Influenza negative.  Lactic acid normal.   Disposition Plan: Status is: Inpatient  Remains inpatient appropriate because: Severe respiratory distress with hypoxia requiring BiPAP therapy.  Time spent: 35 minutes [facility] 06/24/2021, 6:31 PM

## 2021-06-25 ENCOUNTER — Inpatient Hospital Stay (HOSPITAL_COMMUNITY)
Admit: 2021-06-25 | Discharge: 2021-06-25 | Disposition: A | Discharge status: Home / Self Care | Payer: BC Managed Care – PPO | Attending: Internal Medicine | Admitting: Internal Medicine

## 2021-06-25 DIAGNOSIS — J9601 Acute respiratory failure with hypoxia: Secondary | ICD-10-CM

## 2021-06-25 DIAGNOSIS — R778 Other specified abnormalities of plasma proteins: Secondary | ICD-10-CM

## 2021-06-25 MED ORDER — DM-GUAIFENESIN ER 30-600 MG PO TB12
1.0000 | ORAL_TABLET | Freq: Two times a day (BID) | ORAL | Status: DC
  Administered 2021-06-25 – 2021-06-28 (×7): 1 via ORAL
  Filled 2021-06-25 (×7): qty 1

## 2021-06-25 MED ORDER — SIMVASTATIN 20 MG PO TABS
40.0000 mg | ORAL_TABLET | Freq: Every day | ORAL | Status: DC
  Administered 2021-06-25 – 2021-06-27 (×3): 40 mg via ORAL
  Filled 2021-06-25 (×3): qty 2

## 2021-06-25 NOTE — Progress Notes (Signed)
*  PRELIMINARY RESULTS* Echocardiogram 2D Echocardiogram has been performed.  Cristela Blue 06/25/2021, 3:40 PM

## 2021-06-25 NOTE — Plan of Care (Signed)
°  Problem: Activity: °Goal: Ability to tolerate increased activity will improve °Outcome: Progressing °  °Problem: Respiratory: °Goal: Ability to maintain a clear airway will improve °Outcome: Progressing °Goal: Levels of oxygenation will improve °Outcome: Progressing °Goal: Ability to maintain adequate ventilation will improve °Outcome: Progressing °  °

## 2021-06-25 NOTE — Progress Notes (Signed)
  Progress Note    Gina Bates   [id number]  DOB: 11-Jan-1961  DOA: 06/23/2021     2 Date of Service: 06/25/2021   Clinical Course 11/24.  Admitted with COPD exacerbation.  Required BiPAP therapy. 11/25 off of BiPAP. 11/26, required BiPAP in the morning still on 6 LPM.  Saturation dropped to 80s on 4 L.  Assessment and Plan Acute respiratory failure with hypoxia, present on admission sats 80s on arrival. Patient continues to remain tripod. Required BiPAP on admission. Most likely viral infection with acute on chronic exacerbation of COPD. PE ruled out D-dimer elevated although likely secondary to infection.  Patient does not have any signs and symptoms of PE or DVT. We will discontinue BiPAP.   COPD with acute bronchitis (HCC) Continue azithromycin Ativan as needed-bronchodilators Continue IV steroids  Viral respiratory tract infection/? COVID and flu negative Recently had an exposure to influenza and was started on Tamiflu outpatient. Influenza is negative we will continue Tamiflu to completion given history of exposure droplet precautions   Elevated troponin Suspect demand ischemia with troponin 20-32, without chest pain or ischemic EKG changes   Hyperglycemia Blood sugar remains elevated. Hemoglobin A1c 5.8.  Subjective:  Shortness of breath is still present.  Required BiPAP in the morning as she had some distress. Currently no chest pain.  No nausea or vomiting.  Off of the machine.  Still on 6 LPM.  Objective Vitals:   06/25/21 1148 06/25/21 1350 06/25/21 1704 06/25/21 1846  BP: 123/62  (!) 109/56 110/60  Pulse: 98  88 79  Resp: 20  (!) 24 18  Temp: 98.5 F (36.9 C)  98.1 F (36.7 C) 98.5 F (36.9 C)  TempSrc: Oral     SpO2: 97% 95% 95% 98%  Weight:      Height:       48.5 kg  Exam General: Appear in mild distress, no Rash; Oral Mucosa Clear, moist. no Abnormal Neck Mass Or lumps, Conjunctiva normal  Cardiovascular: S1 and S2 Present, no  Murmur, Respiratory: increased respiratory effort, Bilateral Air entry present and bilateral  Crackles, no wheezes Abdomen: Bowel Sound present, Soft and no tenderness Extremities: trace Pedal edema Neurology: alert and oriented to time, place, and person affect appropriate. no new focal deficit Gait not checked due to patient safety concerns   Labs / Other Information No significant labs.  Will check tomorrow.   Disposition Plan: Status is: Inpatient  Remains inpatient appropriate because: Respiratory distress continues to require BiPAP therapy.  Time spent: 35 minutes [facility] 06/25/2021, 7:17 PM

## 2021-06-25 NOTE — Progress Notes (Signed)
Placed pt on bipap for sob

## 2021-06-26 DIAGNOSIS — J440 Chronic obstructive pulmonary disease with acute lower respiratory infection: Secondary | ICD-10-CM | POA: Diagnosis not present

## 2021-06-26 DIAGNOSIS — R7301 Impaired fasting glucose: Secondary | ICD-10-CM | POA: Diagnosis not present

## 2021-06-26 DIAGNOSIS — R778 Other specified abnormalities of plasma proteins: Secondary | ICD-10-CM | POA: Diagnosis not present

## 2021-06-26 DIAGNOSIS — J9601 Acute respiratory failure with hypoxia: Secondary | ICD-10-CM | POA: Diagnosis not present

## 2021-06-26 DIAGNOSIS — J209 Acute bronchitis, unspecified: Secondary | ICD-10-CM

## 2021-06-26 LAB — CBC WITH DIFFERENTIAL/PLATELET
Abs Immature Granulocytes: 0.04 10*3/uL (ref 0.00–0.07)
Basophils Absolute: 0 10*3/uL (ref 0.0–0.1)
Basophils Relative: 0 %
Eosinophils Absolute: 0 10*3/uL (ref 0.0–0.5)
Eosinophils Relative: 0 %
HCT: 38.8 % (ref 36.0–46.0)
Hemoglobin: 13 g/dL (ref 12.0–15.0)
Immature Granulocytes: 0 %
Lymphocytes Relative: 36 %
Lymphs Abs: 4.4 10*3/uL — ABNORMAL HIGH (ref 0.7–4.0)
MCH: 28.9 pg (ref 26.0–34.0)
MCHC: 33.5 g/dL (ref 30.0–36.0)
MCV: 86.2 fL (ref 80.0–100.0)
Monocytes Absolute: 1.1 10*3/uL — ABNORMAL HIGH (ref 0.1–1.0)
Monocytes Relative: 9 %
Neutro Abs: 6.6 10*3/uL (ref 1.7–7.7)
Neutrophils Relative %: 55 %
Platelets: 208 10*3/uL (ref 150–400)
RBC: 4.5 MIL/uL (ref 3.87–5.11)
RDW: 12.6 % (ref 11.5–15.5)
WBC: 12 10*3/uL — ABNORMAL HIGH (ref 4.0–10.5)
nRBC: 0 % (ref 0.0–0.2)

## 2021-06-26 LAB — ECHOCARDIOGRAM COMPLETE
Height: 61 in
S' Lateral: 2.1 cm
Weight: 1712 oz

## 2021-06-26 LAB — BASIC METABOLIC PANEL
Anion gap: 7 (ref 5–15)
BUN: 20 mg/dL (ref 6–20)
CO2: 30 mmol/L (ref 22–32)
Calcium: 8.9 mg/dL (ref 8.9–10.3)
Chloride: 103 mmol/L (ref 98–111)
Creatinine, Ser: 0.63 mg/dL (ref 0.44–1.00)
GFR, Estimated: >60 mL/min (ref >60)
Glucose, Bld: 97 mg/dL (ref 70–99)
Potassium: 3.8 mmol/L (ref 3.5–5.1)
Sodium: 140 mmol/L (ref 135–145)

## 2021-06-26 MED ORDER — LOPERAMIDE HCL 2 MG PO CAPS: 2.0000 mg | ORAL_CAPSULE | ORAL | Status: DC | PRN

## 2021-06-26 MED ORDER — ENOXAPARIN SODIUM 40 MG/0.4ML IJ SOSY
40.0000 mg | PREFILLED_SYRINGE | INTRAMUSCULAR | Status: DC
  Administered 2021-06-26 – 2021-06-28 (×3): 40 mg via SUBCUTANEOUS
  Filled 2021-06-26 (×3): qty 0.4

## 2021-06-26 NOTE — Progress Notes (Addendum)
Patient ID: Gina Bates, female   DOB: 1961/07/09, 60 y.o.   MRN: [id number] [facility] PROGRESS NOTE  Diesha Rostad [id number]:[id number] DOB: 06/09/1961 DOA: 06/23/2021 PCP: Laqueta Carina, MD  HPI/Subjective: Patient feeling better than when she came in.  Still having some cough and shortness of breath.  States she is a former smoker.  Still short of breath with moving around.  Admitted with COPD exacerbation and acute hypoxic respiratory failure.  Objective: Vitals:   06/26/21 1157 06/26/21 1423  BP: (!) 119/92 124/65  Pulse: 86 89  Resp: 18 19  Temp: 98.6 F (37 C) 98.4 F (36.9 C)  SpO2: 91% 94%    Intake/Output Summary (Last 24 hours) at 06/26/2021 1456 Last data filed at 06/26/2021 1018 Gross per 24 hour  Intake 360 ml  Output --  Net 360 ml   Filed Weights   06/24/21 0302  Weight: 48.5 kg    ROS: Review of Systems  Respiratory:  Positive for cough and shortness of breath.   Cardiovascular:  Negative for chest pain.  Gastrointestinal:  Negative for abdominal pain, nausea and vomiting.  Exam: Physical Exam HENT:     Head: Normocephalic.     Mouth/Throat:     Pharynx: No oropharyngeal exudate.  Eyes:     General: Lids are normal.     Conjunctiva/sclera: Conjunctivae normal.  Cardiovascular:     Rate and Rhythm: Normal rate and regular rhythm.     Heart sounds: Normal heart sounds, S1 normal and S2 normal.  Pulmonary:     Breath sounds: Examination of the right-lower field reveals decreased breath sounds. Examination of the left-lower field reveals decreased breath sounds. Decreased breath sounds present. No wheezing, rhonchi or rales.  Abdominal:     Palpations: Abdomen is soft.     Tenderness: There is no abdominal tenderness.  Musculoskeletal:     Right lower leg: No swelling.     Left lower leg: No swelling.  Skin:    General: Skin is warm.     Findings: No rash.  Neurological:     Mental Status: She is alert and oriented to person, place, and  time.      Scheduled Meds:  azithromycin  500 mg Oral Daily   dextromethorphan-guaiFENesin  1 tablet Oral BID   enoxaparin (LOVENOX) injection  40 mg Subcutaneous Q24H   feeding supplement  237 mL Oral BID BM   ipratropium-albuterol  3 mL Nebulization Q6H   methylPREDNISolone (SOLU-MEDROL) injection  80 mg Intravenous Q24H   multivitamin with minerals  1 tablet Oral Daily   simvastatin  40 mg Oral QHS    Assessment/Plan:  Acute hypoxic respiratory failure.  This morning patient was on 4 L.  Today I took the patient off oxygen and pulse ox dropped down to 86%.  Pulse ox in the 80s upon arrival.  Placed back on 2 L.  We will asked nursing staff to ambulate this afternoon. COPD exacerbation with bronchitis on Zithromax nebulizer treatments and Solu-Medrol. Influenza exposure.  Completed prophylactic Tamiflu Elevated troponin likely demand ischemia from acute hypoxic respiratory failure Impaired fasting glucose.  Hemoglobin A1c 5.8.        Code Status:     Code Status Orders  (From admission, onward)           Start     Ordered   06/23/21 2234  Full code  Continuous        06/23/21 2235  Code Status History     This patient has a current code status but no historical code status.      Family Communication: Spoke with patient's daughter at the bedside Disposition Plan: Status is: Inpatient  Antibiotics: Zithromax  Time spent: 27 minutes  Chidera [employer]

## 2021-06-26 NOTE — Progress Notes (Signed)
PT Cancellation Note  Patient Details Name: Gina Bates MRN: [id number] DOB: 12/08/1960   Cancelled Treatment:    Reason Eval/Treat Not Completed: Patient declined, no reason specified (Pt declined as she had just finished eating and was off O2. Request PT to come back later.) Will follow up as time allows.    [name] [name] 06/26/2021, 1:39 PM

## 2021-06-26 NOTE — Progress Notes (Signed)
PHARMACIST - PHYSICIAN COMMUNICATION  CONCERNING:  Enoxaparin (Lovenox) for DVT Prophylaxis    RECOMMENDATION: Patient was prescribed enoxaparin 30mg  q24 hours for VTE prophylaxis.   Filed Weights   06/24/21 0302  Weight: 48.5 kg (107 lb)    Body mass index is 20.22 kg/m.  Estimated Creatinine Clearance: 56.4 mL/min (by C-G formula based on SCr of 0.63 mg/dL).   Patient is candidate for enoxaparin 40mg  every 24 hours based on CrCl >71ml/min and Weight >45kg  DESCRIPTION: Pharmacy has adjusted enoxaparin dose per [facility] policy.  Patient is now receiving enoxaparin 40 mg every 24 hours   31m 06/26/2021 9:25 AM

## 2021-06-26 NOTE — Progress Notes (Signed)
PT Cancellation Note  Patient Details Name: Gina Bates MRN: [id number] DOB: 09-25-1960   Cancelled Treatment:    Reason Eval/Treat Not Completed: Patient declined, no reason specified Agreeable to PT tomorrow.    Kayode Petion A Alisah Grandberry 06/26/2021, 2:08 PM

## 2021-06-27 ENCOUNTER — Encounter: Payer: Self-pay | Admitting: Internal Medicine

## 2021-06-27 ENCOUNTER — Inpatient Hospital Stay: Payer: BC Managed Care – PPO

## 2021-06-27 DIAGNOSIS — R7301 Impaired fasting glucose: Secondary | ICD-10-CM | POA: Diagnosis not present

## 2021-06-27 DIAGNOSIS — J440 Chronic obstructive pulmonary disease with acute lower respiratory infection: Secondary | ICD-10-CM | POA: Diagnosis not present

## 2021-06-27 DIAGNOSIS — R7989 Other specified abnormal findings of blood chemistry: Secondary | ICD-10-CM

## 2021-06-27 DIAGNOSIS — J9601 Acute respiratory failure with hypoxia: Secondary | ICD-10-CM | POA: Diagnosis not present

## 2021-06-27 DIAGNOSIS — R778 Other specified abnormalities of plasma proteins: Secondary | ICD-10-CM | POA: Diagnosis not present

## 2021-06-27 LAB — CBC WITH DIFFERENTIAL/PLATELET
Abs Immature Granulocytes: 0.06 10*3/uL (ref 0.00–0.07)
Basophils Absolute: 0 10*3/uL (ref 0.0–0.1)
Basophils Relative: 0 %
Eosinophils Absolute: 0 10*3/uL (ref 0.0–0.5)
Eosinophils Relative: 0 %
HCT: 40.6 % (ref 36.0–46.0)
Hemoglobin: 13.7 g/dL (ref 12.0–15.0)
Immature Granulocytes: 1 %
Lymphocytes Relative: 35 %
Lymphs Abs: 3.9 10*3/uL (ref 0.7–4.0)
MCH: 29.4 pg (ref 26.0–34.0)
MCHC: 33.7 g/dL (ref 30.0–36.0)
MCV: 87.1 fL (ref 80.0–100.0)
Monocytes Absolute: 0.9 10*3/uL (ref 0.1–1.0)
Monocytes Relative: 8 %
Neutro Abs: 6.2 10*3/uL (ref 1.7–7.7)
Neutrophils Relative %: 56 %
Platelets: 232 10*3/uL (ref 150–400)
RBC: 4.66 MIL/uL (ref 3.87–5.11)
RDW: 12.3 % (ref 11.5–15.5)
WBC: 11.1 10*3/uL — ABNORMAL HIGH (ref 4.0–10.5)
nRBC: 0 % (ref 0.0–0.2)

## 2021-06-27 LAB — BASIC METABOLIC PANEL
Anion gap: 7 (ref 5–15)
BUN: 19 mg/dL (ref 6–20)
CO2: 31 mmol/L (ref 22–32)
Calcium: 9.1 mg/dL (ref 8.9–10.3)
Chloride: 103 mmol/L (ref 98–111)
Creatinine, Ser: 0.58 mg/dL (ref 0.44–1.00)
GFR, Estimated: >60 mL/min (ref >60)
Glucose, Bld: 101 mg/dL — ABNORMAL HIGH (ref 70–99)
Potassium: 3.9 mmol/L (ref 3.5–5.1)
Sodium: 141 mmol/L (ref 135–145)

## 2021-06-27 IMAGING — CT CT ANGIO CHEST
2 of 7 series · 18 of 46 positions shown · IV contrast (APPLIED)
Comparison: None.

CLINICAL DATA: PE suspected, COPD, flu-like symptoms, productive
cough

EXAM:
CT ANGIOGRAPHY CHEST WITH CONTRAST
TECHNIQUE: Multidetector CT imaging of the chest was performed using the
standard protocol during bolus administration of intravenous
contrast. Multiplanar CT image reconstructions and MIPs were
obtained to evaluate the vascular anatomy.
CONTRAST:  75mL OMNIPAQUE IOHEXOL 350 MG/ML SOLN

[Series 6: thins · axial · 0.63mm/px · z∈[-357,-77]mm · 15 of 390 slices shown]
[im 20/390  lung]
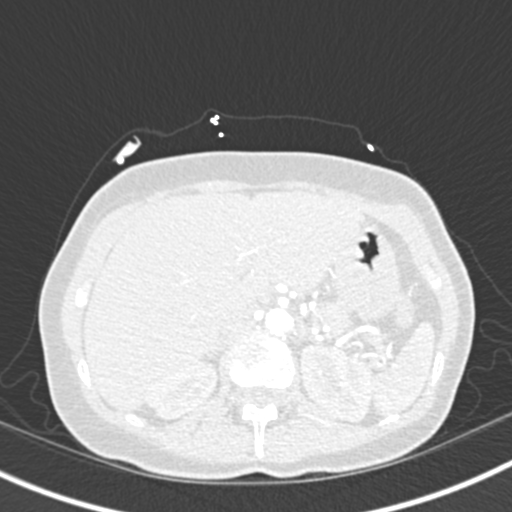
[im 39/390  soft-tissue]
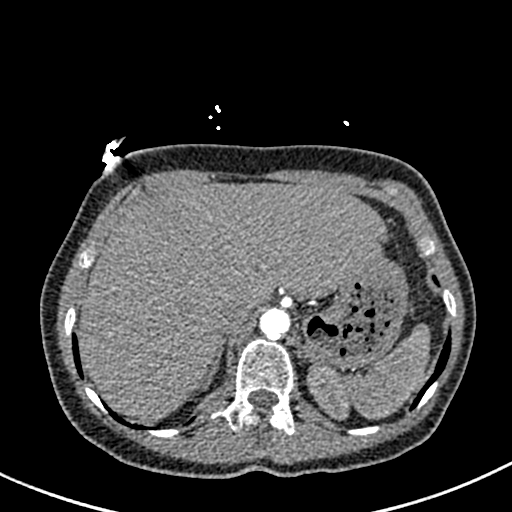
[im 78/390  lung]
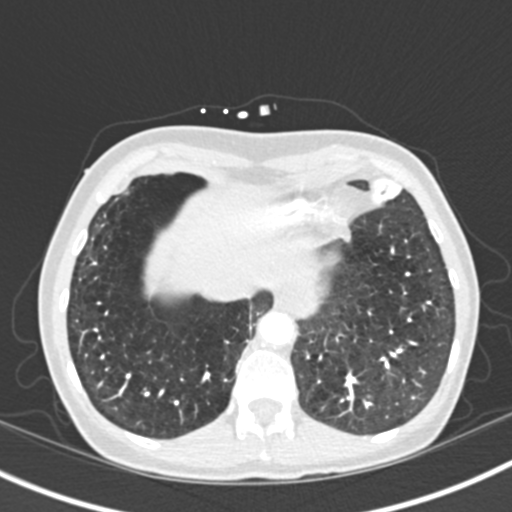
[im 98/390  soft-tissue]
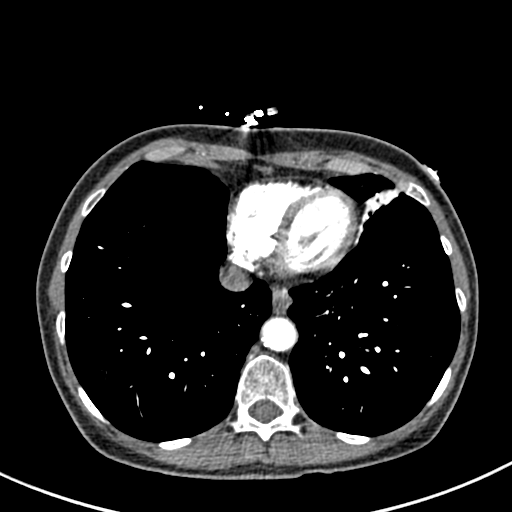
[im 117/390  lung]
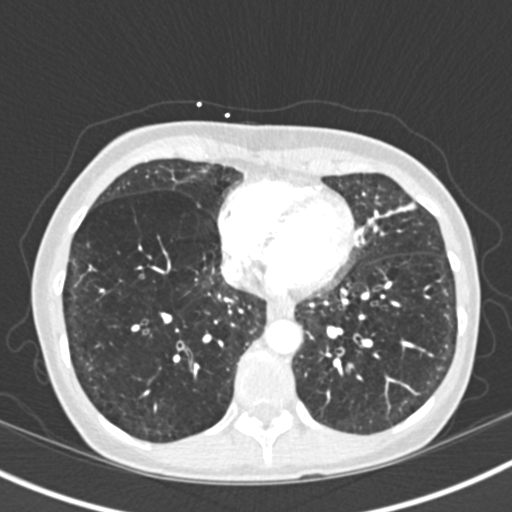
[im 137/390  soft-tissue]
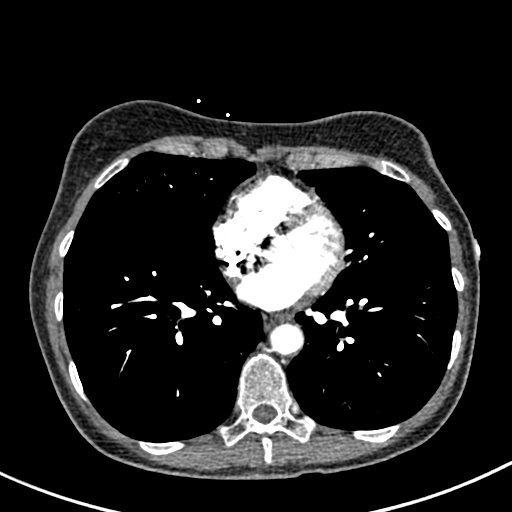
[im 176/390  lung]
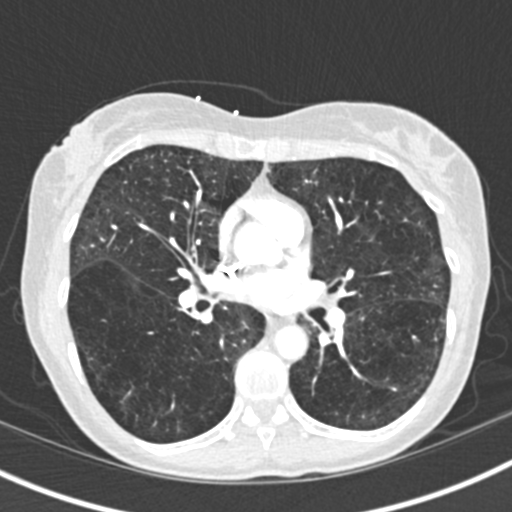
[im 195/390  soft-tissue]
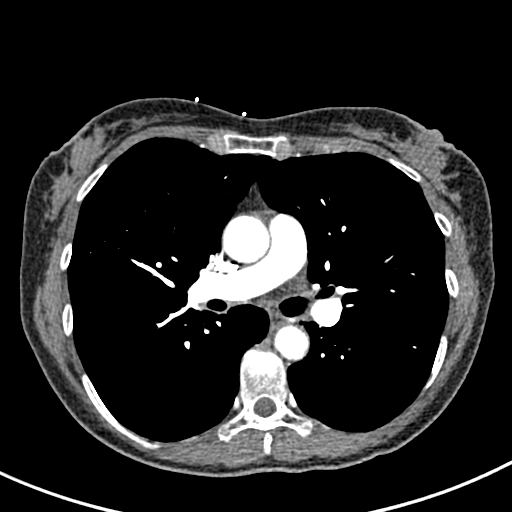
[im 214/390  lung]
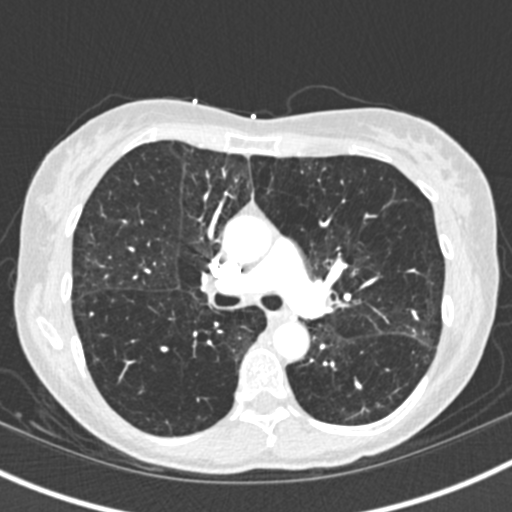
[im 253/390  soft-tissue]
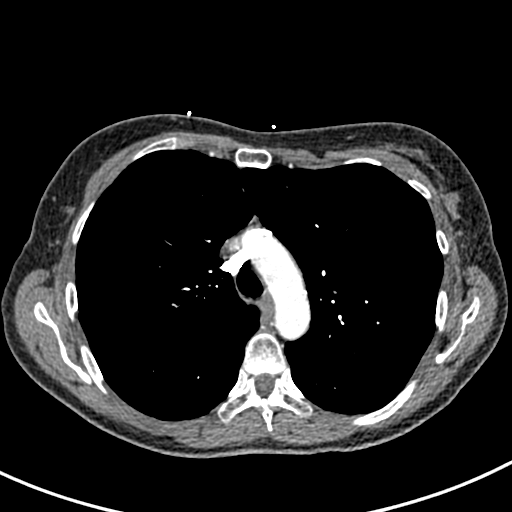
[im 273/390  lung]
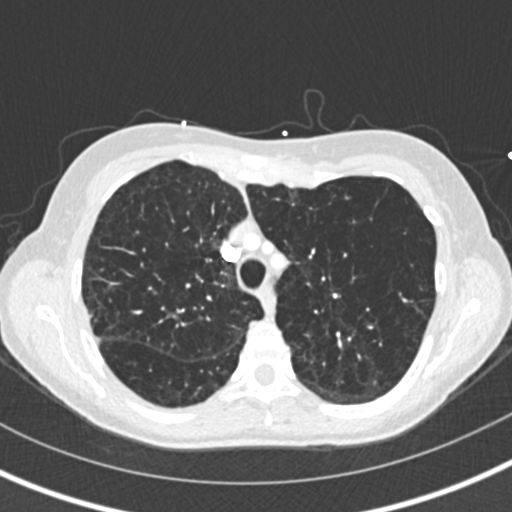
[im 292/390  soft-tissue]
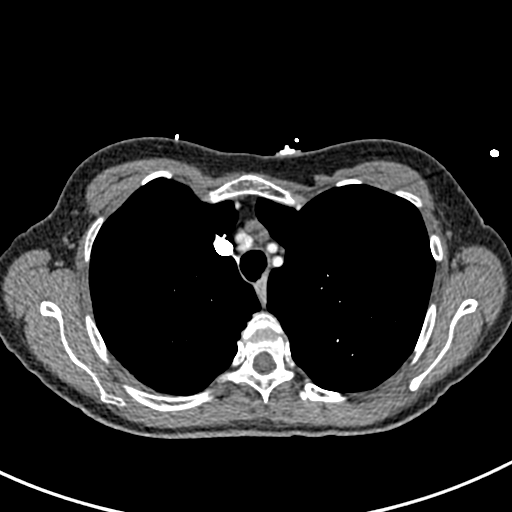
[im 312/390  lung]
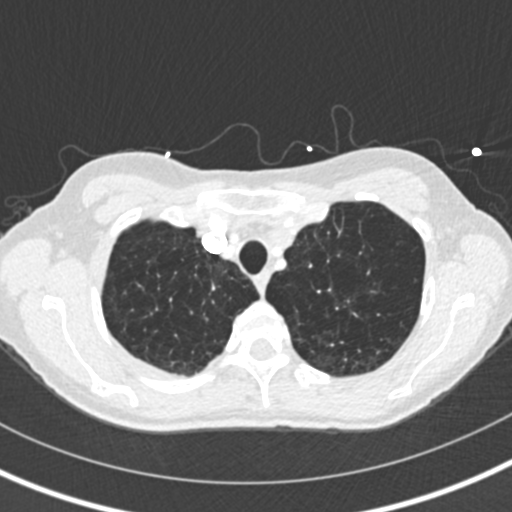
[im 351/390  soft-tissue]
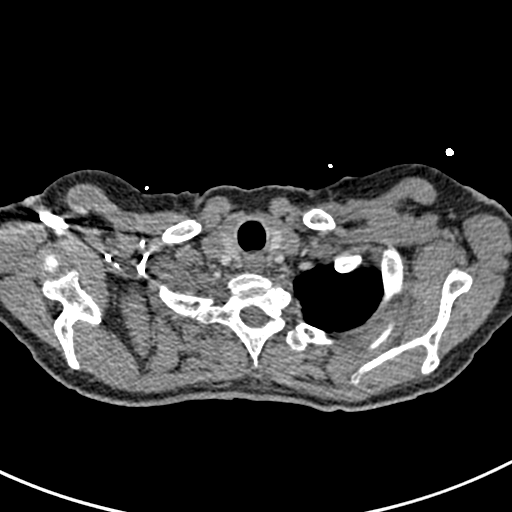
[im 370/390  lung]
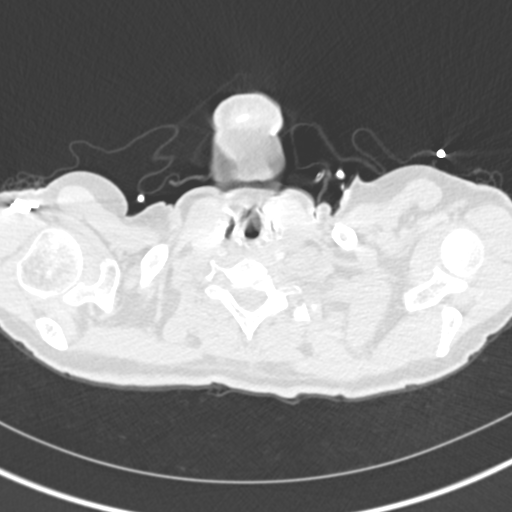

[Series 8: coronal mpr · coronal · 0.61mm/px · 3 of 106 slices shown]
[im 27/106  soft-tissue]
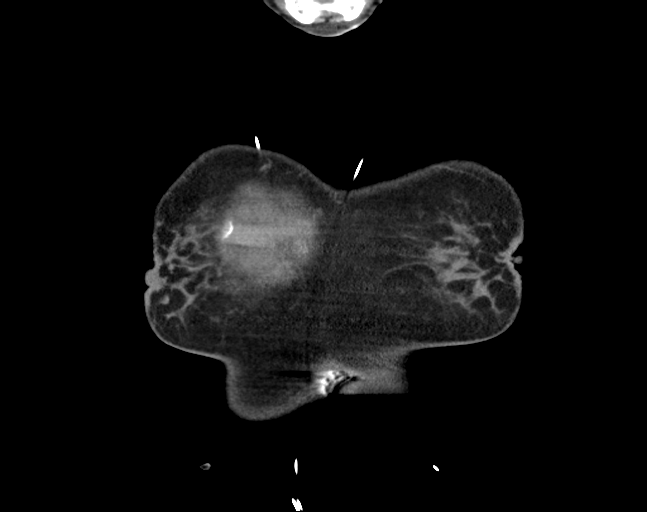
[im 53/106  soft-tissue]
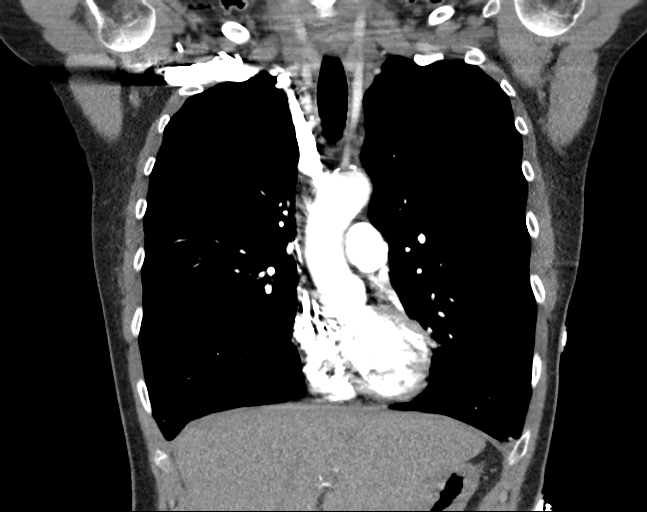
[im 79/106  soft-tissue]
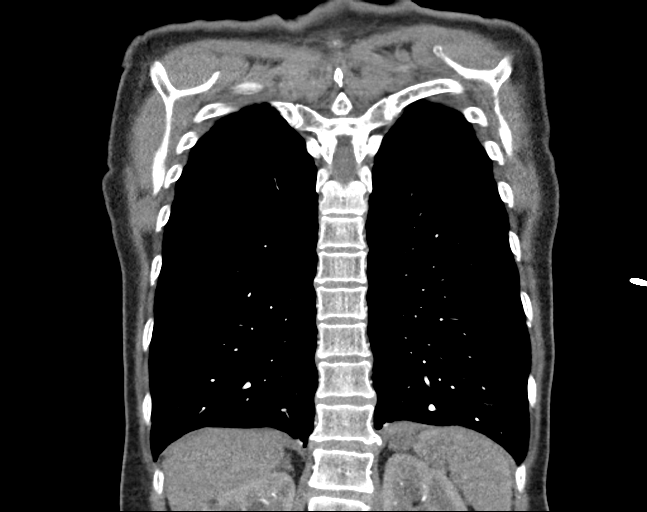

[18 of 46 positions shown; findings below may reference images not displayed]

FINDINGS: Cardiovascular: Satisfactory opacification of the pulmonary arteries
to the segmental level. No evidence of pulmonary embolism. Normal
heart size. Left coronary artery calcifications. No pericardial
effusion. Aortic atherosclerosis.

Mediastinum/Nodes: No enlarged mediastinal, hilar, or axillary lymph
nodes. Thyroid gland, trachea, and esophagus demonstrate no
significant findings.

Lungs/Pleura: Severe centrilobular emphysema. Mild, diffuse
bilateral bronchial wall thickening. Bandlike scarring and
consolidation of the lingula. No pleural effusion or pneumothorax.

Upper Abdomen: No acute abnormality.

Musculoskeletal: No chest wall abnormality. No acute or significant
osseous findings.

Review of the MIP images confirms the above findings.
IMPRESSION: 1. Negative examination for pulmonary embolism.
2. Severe emphysema and diffuse bilateral bronchial wall thickening.
3. Bandlike scarring and consolidation of the lingula, consistent
with sequelae of prior infection or inflammation
4. Coronary artery disease.

Aortic Atherosclerosis (UCF2G-X8F.F) and Emphysema (UCF2G-VHH.S).

## 2021-06-27 MED ORDER — METHYLPREDNISOLONE SODIUM SUCC 40 MG IJ SOLR
40.0000 mg | INTRAMUSCULAR | Status: DC
  Administered 2021-06-28: 40 mg via INTRAVENOUS
  Filled 2021-06-27: qty 1

## 2021-06-27 MED ORDER — IOHEXOL 350 MG/ML SOLN
75.0000 mL | Freq: Once | INTRAVENOUS | Status: AC | PRN
  Administered 2021-06-27: 11:00:00 75 mL via INTRAVENOUS

## 2021-06-27 NOTE — Progress Notes (Signed)
Patient ID: Gina Bates, female   DOB: [birth year], 60 y.o.   MRN: [id number] [facility] PROGRESS NOTE  Kanitra Purifoy [id number]:[id number] DOB: May 19, 1961 DOA: 06/23/2021 PCP: Laqueta Carina, MD  HPI/Subjective: Patient feeling little bit better.  Still with some shortness of breath with activity.  Still with a little cough.  Today still unable to come off the oxygen with pulse ox dropping into the 80s with moving around.  Objective: Vitals:   06/27/21 1314 06/27/21 1532  BP:  118/73  Pulse:  91  Resp:  18  Temp:  98.3 F (36.8 C)  SpO2: 94% 91%    Intake/Output Summary (Last 24 hours) at 06/27/2021 1600 Last data filed at 06/27/2021 1300 Gross per 24 hour  Intake 480 ml  Output --  Net 480 ml   Filed Weights   06/24/21 0302  Weight: 48.5 kg    ROS: Review of Systems  Respiratory:  Positive for cough, shortness of breath and wheezing.   Cardiovascular:  Negative for chest pain.  Gastrointestinal:  Negative for abdominal pain, nausea and vomiting.  Exam: Physical Exam HENT:     Head: Normocephalic.     Mouth/Throat:     Pharynx: No oropharyngeal exudate.  Eyes:     General: Lids are normal.     Conjunctiva/sclera: Conjunctivae normal.  Cardiovascular:     Rate and Rhythm: Normal rate and regular rhythm.     Heart sounds: Normal heart sounds, S1 normal and S2 normal.  Pulmonary:     Breath sounds: Examination of the right-lower field reveals decreased breath sounds. Examination of the left-lower field reveals decreased breath sounds. Decreased breath sounds present. No wheezing, rhonchi or rales.  Abdominal:     Palpations: Abdomen is soft.     Tenderness: There is no abdominal tenderness.  Musculoskeletal:     Right lower leg: No swelling.     Left lower leg: No swelling.  Skin:    General: Skin is warm.     Findings: No rash.  Neurological:     Mental Status: She is alert and oriented to person, place, and time.      Scheduled Meds:   dextromethorphan-guaiFENesin  1 tablet Oral BID   enoxaparin (LOVENOX) injection  40 mg Subcutaneous Q24H   feeding supplement  237 mL Oral BID BM   ipratropium-albuterol  3 mL Nebulization Q6H   methylPREDNISolone (SOLU-MEDROL) injection  80 mg Intravenous Q24H   multivitamin with minerals  1 tablet Oral Daily   simvastatin  40 mg Oral QHS    Assessment/Plan:  Acute hypoxic respiratory failure.  This morning she was on 2 L of oxygen.  Pulse ox initially in the 80s when she came in.  With ambulation today off oxygen pulse ox dropped down into the mid 80s.  So far unable to come off oxygen.  CT scan of the chest today negative for pulmonary embolism but does show severe emphysema.  Patient may end up needing home oxygen. COPD exacerbation with bronchitis.  Completed Zithromax.  Continue Solu-Medrol. Influenza exposure.  Completed prophylactic Tamiflu Elevated troponin likely demand ischemia from acute hypoxic respiratory failure Impaired fasting glucose.  Hemoglobin A1c 5.8. Elevated D-dimer.  CT scan of the chest negative for PE.  Likely elevated with infection     Code Status:     Code Status Orders  (From admission, onward)           Start     Ordered   06/23/21 2234  Full code  Continuous        06/23/21 2235           Code Status History     This patient has a current code status but no historical code status.      Family Communication: Family sleeping in the room while I was there Disposition Plan: Status is: Inpatient.  Trying to get this patient off oxygen prior to disposition but may end up needing chronic oxygen.  Time spent: 27 minutes  Olivea [employer]

## 2021-06-27 NOTE — Progress Notes (Signed)
Physical Therapy Treatment Patient Details Name: Gina Bates MRN: [id number] DOB: 1960-12-12 Today's Date: 06/27/2021   History of Present Illness Gina Bates is a 60 y.o. female with medical history significant for COPD, not on home oxygen with flulike symptoms x4 days with cough productive of yellow sputum and subjective fever, following exposure to grandson who is flu positive who presents to the ED with sudden onset shortness of breath that started 7 hours prior to presentation.    PT Comments    Patient easily wakes, on room air initially, and able to maintain 90% at rest. With any mobility attempt, pt did desaturate to mid-high 80s. Ultimately placed on 2L, RN/MD aware. She was able to perform bed mobility modI, sit <> stand several times independently (assist for lines/leads management). Pt did endorse feeling weak and "jelly legs". She ambulated 52ft twice with supervision, and an additional 39ft to commode and back. After exertion she did drop to high 80s but with rest and PLB, able to increase to 90% or above. Recommendation updated at this time to outpatient PT to maximize pt endurance, activity tolerance, and strength. The patient would benefit from further skilled PT intervention to continue to progress towards goals. Recommendation remains appropriate.     Recommendations for follow up therapy are one component of a multi-disciplinary discharge planning process, led by the attending physician.  Recommendations may be updated based on patient status, additional functional criteria and insurance authorization.  Follow Up Recommendations  Outpatient PT     Assistance Recommended at Discharge Intermittent Supervision/Assistance  Equipment Recommendations  None recommended by PT    Recommendations for Other Services       Precautions / Restrictions Precautions Precautions: Fall Restrictions Weight Bearing Restrictions: No     Mobility  Bed Mobility Overal bed  mobility: Independent                  Transfers Overall transfer level: Independent Equipment used: None               General transfer comment: definite use of hands but no DME    Ambulation/Gait Ambulation/Gait assistance: Supervision   Assistive device: None   Gait velocity: decreased     General Gait Details: 16ft x2 bouts, 25 x1bout to the bathroom. pt with one instance of small LOB but able to correct without assistance.   Stairs             Wheelchair Mobility    Modified Rankin (Stroke Patients Only)       Balance Overall balance assessment: Needs assistance Sitting-balance support: Feet supported Sitting balance-Leahy Scale: Good       Standing balance-Leahy Scale: Good                              Cognition Arousal/Alertness: Awake/alert Behavior During Therapy: WFL for tasks assessed/performed Overall Cognitive Status: Within Functional Limits for tasks assessed                                          Exercises Other Exercises Other Exercises: educated on PLB, activity pacing, home set up for safety at discharge, DME needs    General Comments        Pertinent Vitals/Pain Pain Assessment: No/denies pain    Home Living  Prior Function            PT Goals (current goals can now be found in the care plan section) Progress towards PT goals: Progressing toward goals    Frequency    Min 2X/week      PT Plan Discharge plan needs to be updated    Co-evaluation              AM-PAC PT "6 Clicks" Mobility   Outcome Measure  Help needed turning from your back to your side while in a flat bed without using bedrails?: None Help needed moving from lying on your back to sitting on the side of a flat bed without using bedrails?: None Help needed moving to and from a bed to a chair (including a wheelchair)?: None Help needed standing up from a chair  using your arms (e.g., wheelchair or bedside chair)?: None Help needed to walk in hospital room?: A Little Help needed climbing 3-5 steps with a railing? : A Little 6 Click Score: 22    End of Session Equipment Utilized During Treatment: Gait belt;Oxygen (2L) Activity Tolerance: Patient tolerated treatment well Patient left: in bed;with call bell/phone within reach;with nursing/sitter in room;with family/visitor present Nurse Communication: Mobility status PT Visit Diagnosis: Unsteadiness on feet (R26.81);Muscle weakness (generalized) (M62.81)     Time: 4196-2229 PT Time Calculation (min) (ACUTE ONLY): 28 min  Charges:  $Therapeutic Exercise: 8-22 mins $Therapeutic Activity: 8-22 mins                     Olga Coaster PT, DPT 10:34 AM,06/27/21

## 2021-06-27 NOTE — Progress Notes (Signed)
Oxygen Qualification Note  SaO2 on room air at rest = 90% SaO2 on room air while ambulating = 86% SaO2 on 2 liters of O2 while ambulating = 90%  Olga Coaster PT, DPT 10:27 AM,06/27/21

## 2021-06-27 NOTE — Progress Notes (Signed)
Nutrition Follow-up  DOCUMENTATION CODES:   Not applicable  INTERVENTION:   -D/c Ensure Enlive po BID, each supplement provides 350 kcal and 20 grams of protein  -MVI with minerals daily  NUTRITION DIAGNOSIS:   Increased nutrient needs related to chronic illness (COPD) as evidenced by estimated needs.  Ongoing  GOAL:   Patient will meet greater than or equal to 90% of their needs  Progressing   MONITOR:   PO intake, Supplement acceptance, Labs, Weight trends, Skin, I & O's  REASON FOR ASSESSMENT:   Consult Assessment of nutrition requirement/status  ASSESSMENT:   Gina Bates is a 60 y.o. female with medical history significant for COPD, not on home oxygen with flulike symptoms x4 days with cough productive of yellow sputum and subjective fever, following exposure to grandson who is flu positive who presents to the ED with sudden onset shortness of breath that started 7 hours prior to presentation.  Patient had been taking Tamiflu for the past 4 days prescribed by her PCP based on his symptoms and her grandsons positive influenza test.  She denies nausea, vomiting or chills.  Denies lower extremity pain or swelling and denies chest pain.  EMS reported O2 sats in the 80s on room air and she was transported on CPAP.  She received duo nebs and Solu-Medrol in route.  Reviewed I/O's: +360 ml x 24 hours and -740 ml since admission   Spoke with pt and husband at bedside. Per pt, she has had decreased appetite for a week secondary to feeling poorly, but this is improving. She has been consuming most of her meals (PO: 50-100%), as well as snacks that family has brought in. PTA she consumes 2-3 meals per day (Breakfast: cereal, Lunch: salad; Dinner: meat, starch, and vegetable.   Pt denies any recent wt loss. She typically weighs 105-110#. She shares that she experienced distant progressive wt loss several years ago, but this has stabilized.   Discussed importance of good meal and  supplement intake to promote healing. Pt with no further nutritional concerns at this time.   Medications reviewed and include solu-medrol.   Labs reviewed.   NUTRITION - FOCUSED PHYSICAL EXAM:  Flowsheet Row Most Recent Value  Orbital Region No depletion  Upper Arm Region No depletion  Thoracic and Lumbar Region No depletion  Buccal Region No depletion  Temple Region No depletion  Clavicle Bone Region No depletion  Clavicle and Acromion Bone Region No depletion  Scapular Bone Region No depletion  Dorsal Hand No depletion  Patellar Region No depletion  Anterior Thigh Region No depletion  Posterior Calf Region No depletion  Edema (RD Assessment) None  Hair Reviewed  Eyes Reviewed  Mouth Reviewed  Skin Reviewed  Nails Reviewed       Diet Order:   Diet Order             Diet regular Room service appropriate? Yes; Fluid consistency: Thin  Diet effective now                   EDUCATION NEEDS:   No education needs have been identified at this time  Skin:  Skin Assessment: Reviewed RN Assessment  Last BM:  Unknown  Height:   Ht Readings from Last 1 Encounters:  06/24/21 5\' 1"  (1.549 m)    Weight:   Wt Readings from Last 1 Encounters:  06/24/21 48.5 kg    Ideal Body Weight:  47.7 kg  BMI:  Body mass index is 20.22 kg/m.  Estimated Nutritional Needs:   Kcal:  1500-1700  Protein:  75-90 grams  Fluid:  > 1.5 L    Levada Schilling, RD, LDN, CDCES Registered Dietitian II Certified Diabetes Care and Education Specialist Please refer to [facility] for RD and/or RD on-call/weekend/after hours pager

## 2021-06-28 DIAGNOSIS — R7301 Impaired fasting glucose: Secondary | ICD-10-CM | POA: Diagnosis not present

## 2021-06-28 DIAGNOSIS — J9601 Acute respiratory failure with hypoxia: Secondary | ICD-10-CM | POA: Diagnosis not present

## 2021-06-28 DIAGNOSIS — J440 Chronic obstructive pulmonary disease with acute lower respiratory infection: Secondary | ICD-10-CM | POA: Diagnosis not present

## 2021-06-28 DIAGNOSIS — R778 Other specified abnormalities of plasma proteins: Secondary | ICD-10-CM | POA: Diagnosis not present

## 2021-06-28 LAB — CBC WITH DIFFERENTIAL/PLATELET
Abs Immature Granulocytes: 0.08 10*3/uL — ABNORMAL HIGH (ref 0.00–0.07)
Basophils Absolute: 0 10*3/uL (ref 0.0–0.1)
Basophils Relative: 0 %
Eosinophils Absolute: 0 10*3/uL (ref 0.0–0.5)
Eosinophils Relative: 0 %
HCT: 41.4 % (ref 36.0–46.0)
Hemoglobin: 13.9 g/dL (ref 12.0–15.0)
Immature Granulocytes: 1 %
Lymphocytes Relative: 41 %
Lymphs Abs: 4.7 10*3/uL — ABNORMAL HIGH (ref 0.7–4.0)
MCH: 28.9 pg (ref 26.0–34.0)
MCHC: 33.6 g/dL (ref 30.0–36.0)
MCV: 86.1 fL (ref 80.0–100.0)
Monocytes Absolute: 0.9 10*3/uL (ref 0.1–1.0)
Monocytes Relative: 8 %
Neutro Abs: 5.7 10*3/uL (ref 1.7–7.7)
Neutrophils Relative %: 50 %
Platelets: 257 10*3/uL (ref 150–400)
RBC: 4.81 MIL/uL (ref 3.87–5.11)
RDW: 12.5 % (ref 11.5–15.5)
WBC: 11.5 10*3/uL — ABNORMAL HIGH (ref 4.0–10.5)
nRBC: 0 % (ref 0.0–0.2)

## 2021-06-28 LAB — BASIC METABOLIC PANEL
Anion gap: 8 (ref 5–15)
BUN: 20 mg/dL (ref 6–20)
CO2: 28 mmol/L (ref 22–32)
Calcium: 9.2 mg/dL (ref 8.9–10.3)
Chloride: 104 mmol/L (ref 98–111)
Creatinine, Ser: 0.74 mg/dL (ref 0.44–1.00)
GFR, Estimated: >60 mL/min (ref >60)
Glucose, Bld: 92 mg/dL (ref 70–99)
Potassium: 3.5 mmol/L (ref 3.5–5.1)
Sodium: 140 mmol/L (ref 135–145)

## 2021-06-28 LAB — CULTURE, BLOOD (ROUTINE X 2)
Culture: NO GROWTH
Culture: NO GROWTH
Special Requests: ADEQUATE
Special Requests: ADEQUATE

## 2021-06-28 MED ORDER — ADULT MULTIVITAMIN W/MINERALS CH: 1.0000 | ORAL_TABLET | Freq: Every day | ORAL | Status: AC

## 2021-06-28 MED ORDER — ALBUTEROL SULFATE HFA 108 (90 BASE) MCG/ACT IN AERS: 2.0000 | INHALATION_SPRAY | Freq: Four times a day (QID) | RESPIRATORY_TRACT | 0 refills | Status: AC | PRN

## 2021-06-28 MED ORDER — ALBUTEROL SULFATE (2.5 MG/3ML) 0.083% IN NEBU: 2.5000 mg | INHALATION_SOLUTION | Freq: Four times a day (QID) | RESPIRATORY_TRACT | 0 refills | Status: AC | PRN

## 2021-06-28 NOTE — TOC Transition Note (Signed)
Transition of Care [facility]) - CM/SW Discharge Note   Patient Details  Name: Nikisha Fleece MRN: [id number] Date of Birth: May 16, 1961  Transition of Care [facility]) CM/SW Contact:  Gildardo Griffes, LCSW Phone Number: 06/28/2021, 11:01 AM   Clinical Narrative:     Patient to discharge home today with outpatient PT, prefers Roseanne Reno CSW has sent the referral via fax.   CSW has spoke with Bjorn Loser with [employer] who has delivered oxygen to room.   No other discharge needs identified from patient or family at bedside.     Final next level of care: Home/Self Care Barriers to Discharge: No Barriers Identified   Patient Goals and CMS Choice Patient states their goals for this hospitalization and ongoing recovery are:: to go home CMS Medicare.gov Compare Post Acute Care list provided to:: Patient Choice offered to / list presented to : Patient  Discharge Placement                       Discharge Plan and Services                DME Arranged: Oxygen DME Agency: [employer] Date DME Agency Contacted: 06/28/21 Time DME Agency Contacted: 1030 Representative spoke with at DME Agency: Bjorn Loser            Social Determinants of Health (SDOH) Interventions     Readmission Risk Interventions No flowsheet data found.

## 2021-06-28 NOTE — Plan of Care (Signed)
  Problem: Education: Goal: Knowledge of General Education information will improve Description: Including pain rating scale, medication(s)/side effects and non-pharmacologic comfort measures Outcome: Completed/Met

## 2021-06-28 NOTE — Progress Notes (Signed)
SATURATION QUALIFICATIONS: (This note is used to comply with regulatory documentation for home oxygen)  Patient Saturations on Room Air at Rest = 93%  Patient Saturations on Room Air while Ambulating = 84%  Patient Saturations on 2 Liters of oxygen while Ambulating = 95%  Please briefly explain why patient needs home oxygen: Pt desats during ambulation and when not at rest.

## 2021-06-28 NOTE — Discharge Summary (Signed)
[facility] [place] [facility]   PATIENT NAME: Gina Bates    MR#:  [id number]  DATE OF BIRTH:  08/06/1960  DATE OF ADMISSION:  06/23/2021 ADMITTING PHYSICIAN: Andris Baumann, MD  DATE OF DISCHARGE: 06/28/2021 12:16 PM  PRIMARY CARE PHYSICIAN: Laqueta Carina, MD    ADMISSION DIAGNOSIS:  Acute respiratory failure with hypoxia (HCC) [J96.01]  DISCHARGE DIAGNOSIS:  Principal Problem:   Acute respiratory failure with hypoxia (HCC) Active Problems:   COPD with acute bronchitis (HCC)   Hyperglycemia   Elevated troponin   Impaired fasting glucose   Elevated d-dimer   SECONDARY DIAGNOSIS:   Past Medical History:  Diagnosis Date   COPD (chronic obstructive pulmonary disease) (HCC)     HOSPITAL COURSE:   Acute on now chronic hypoxic respiratory failure.  Initial pulse ox when she came in was in the 80s.  With ambulation she did drop down into the mid 80s.  Unable to fully come off oxygen at this time.  CT scan of the chest negative for pulmonary embolism but does show severe emphysema.  Patient prescribed home oxygen and advised to wear it 24/7.  Will refer to pulmonary as outpatient for further evaluation. COPD exacerbation with bronchitis.  Completed Zithromax and steroid course here in the hospital.  Patient does have decreased breath sounds bilaterally but lungs are clear. Influenza exposure.  Completed prophylactic Tamiflu Elevated troponin likely demand ischemia from acute hypoxic respiratory failure Impaired fasting glucose.  Hemoglobin A1c 5.8.  Patient not a diabetic. Elevated D-dimer likely secondary to infection and acute hypoxic respiratory failure.  CT scan of the chest negative for pulmonary embolism.  DISCHARGE CONDITIONS:   Satisfactory  CONSULTS OBTAINED:  None  DRUG ALLERGIES:  No Known Allergies  DISCHARGE MEDICATIONS:   Allergies as of 06/28/2021   No Known Allergies      Medication List     STOP taking these  medications    oseltamivir 75 MG capsule Commonly known as: TAMIFLU       TAKE these medications    albuterol 108 (90 Base) MCG/ACT inhaler Commonly known as: VENTOLIN HFA Inhale 2 puffs into the lungs every 6 (six) hours as needed for shortness of breath. What changed:  how much to take reasons to take this   albuterol (2.5 MG/3ML) 0.083% nebulizer solution Commonly known as: PROVENTIL Take 3 mLs (2.5 mg total) by nebulization every 6 (six) hours as needed for wheezing or shortness of breath. What changed: You were already taking a medication with the same name, and this prescription was added. Make sure you understand how and when to take each.   fluticasone-salmeterol 250-50 MCG/ACT Aepb Commonly known as: ADVAIR Inhale 1 puff into the lungs 2 (two) times daily.   multivitamin with minerals Tabs tablet Take 1 tablet by mouth daily. Start taking on: June 29, 2021   simvastatin 40 MG tablet Commonly known as: ZOCOR Take 40 mg by mouth at bedtime.   Spiriva HandiHaler 18 MCG inhalation capsule Generic drug: tiotropium Place 1 capsule into inhaler and inhale in the morning.               Durable Medical Equipment  (From admission, onward)           Start     Ordered   06/28/21 0834  For home use only DME oxygen  Once       Question Answer Comment  Length of Need Lifetime   Mode or (Route) Nasal cannula  Liters per Minute 2   Frequency Continuous (stationary and portable oxygen unit needed)   Oxygen conserving device Yes   Oxygen delivery system Gas      06/28/21 0833             DISCHARGE INSTRUCTIONS:   Follow-up PMD 5 days Refer to pulmonology  If you experience worsening of your admission symptoms, develop shortness of breath, life threatening emergency, suicidal or homicidal thoughts you must seek medical attention immediately by calling 911 or calling your MD immediately  if symptoms less severe.  You Must read complete  instructions/literature along with all the possible adverse reactions/side effects for all the Medicines you take and that have been prescribed to you. Take any new Medicines after you have completely understood and accept all the possible adverse reactions/side effects.   Please note  You were cared for by a hospitalist during your hospital stay. If you have any questions about your discharge medications or the care you received while you were in the hospital after you are discharged, you can call the unit and asked to speak with the hospitalist on call if the hospitalist that took care of you is not available. Once you are discharged, your primary care physician will handle any further medical issues. Please note that NO REFILLS for any discharge medications will be authorized once you are discharged, as it is imperative that you return to your primary care physician (or establish a relationship with a primary care physician if you do not have one) for your aftercare needs so that they can reassess your need for medications and monitor your lab values.    Today   CHIEF COMPLAINT:   Chief Complaint  Patient presents with   Shortness of Breath   Influenza    HISTORY OF PRESENT ILLNESS:  Gina Bates  is a 60 y.o. female came in with shortness of breath   VITAL SIGNS:  Blood pressure 130/61, heart rate 90, respirations 15, pulse ox 84% while ambulating on room air, pulse ox on 2 L with ambulating 95%.    PHYSICAL EXAMINATION:  GENERAL:  60 y.o.-year-old patient lying in the bed with no acute distress.  EYES: Pupils equal, round, reactive to light and accommodation. No scleral icterus.   HEENT: Head atraumatic, normocephalic. Oropharynx and nasopharynx clear.  LUNGS: decreased breath sounds bilaterally, no wheezing, rales,rhonchi or crepitation. No use of accessory muscles of respiration.  CARDIOVASCULAR: S1, S2 normal. No murmurs, rubs, or gallops.  ABDOMEN: Soft, non-tender,  non-distended.   EXTREMITIES: No pedal edema.  NEUROLOGIC: Cranial nerves II through XII are intact. Muscle strength 5/5 in all extremities. Sensation intact. Gait not checked.  PSYCHIATRIC: The patient is alert and oriented x 3.  SKIN: No obvious rash, lesion, or ulcer.   DATA REVIEW:   CBC Recent Labs  Lab 06/28/21 0432  WBC 11.5*  HGB 13.9  HCT 41.4  PLT 257    Chemistries  Recent Labs  Lab 06/23/21 1758 06/26/21 0533 06/28/21 0432  NA 138   < > 140  K 4.0   < > 3.5  CL 106   < > 104  CO2 22   < > 28  GLUCOSE 246*   < > 92  BUN 14   < > 20  CREATININE 0.79   < > 0.74  CALCIUM 8.7*   < > 9.2  AST 28  --   --   ALT 21  --   --   ALKPHOS 64  --   --  BILITOT 0.7  --   --    < > = values in this interval not displayed.      Microbiology Results  Results for orders placed or performed during the hospital encounter of 06/23/21  Blood culture (routine x 2)     Status: None   Collection Time: 06/23/21  5:47 PM   Specimen: BLOOD  Result Value Ref Range Status   Specimen Description BLOOD BLOOD LEFT FOREARM  Final   Special Requests   Final    BOTTLES DRAWN AEROBIC AND ANAEROBIC Blood Culture adequate volume   Culture   Final    NO GROWTH 5 DAYS Performed at [facility], [address] [place], [place] [address]    Report Status 06/28/2021 FINAL  Final  Blood culture (routine x 2)     Status: None   Collection Time: 06/23/21  5:47 PM   Specimen: BLOOD  Result Value Ref Range Status   Specimen Description BLOOD BLOOD RIGHT HAND  Final   Special Requests   Final    BOTTLES DRAWN AEROBIC AND ANAEROBIC Blood Culture adequate volume   Culture   Final    NO GROWTH 5 DAYS Performed at [facility], [address] [place], [place] [address]    Report Status 06/28/2021 FINAL  Final  Resp Panel by RT-PCR (Flu A&B, Covid) Nasopharyngeal Swab     Status: None   Collection Time: 06/23/21  5:58 PM   Specimen: Nasopharyngeal Swab;  Nasopharyngeal(NP) swabs in vial transport medium  Result Value Ref Range Status   SARS Coronavirus 2 by RT PCR NEGATIVE NEGATIVE Final    Comment: (NOTE) SARS-CoV-2 target nucleic acids are NOT DETECTED.  The SARS-CoV-2 RNA is generally detectable in upper respiratory specimens during the acute phase of infection. The lowest concentration of SARS-CoV-2 viral copies this assay can detect is 138 copies/mL. A negative result does not preclude SARS-Cov-2 infection and should not be used as the sole basis for treatment or other patient management decisions. A negative result may occur with  improper specimen collection/handling, submission of specimen other than nasopharyngeal swab, presence of viral mutation(s) within the areas targeted by this assay, and inadequate number of viral copies(<138 copies/mL). A negative result must be combined with clinical observations, patient history, and epidemiological information. The expected result is Negative.  Fact Sheet for Patients:  BloggerCourse.com  Fact Sheet for Healthcare Providers:  SeriousBroker.it  This test is no t yet approved or cleared by the [place] FDA and  has been authorized for detection and/or diagnosis of SARS-CoV-2 by FDA under an Emergency Use Authorization (EUA). This EUA will remain  in effect (meaning this test can be used) for the duration of the COVID-19 declaration under Section 564(b)(1) of the Act, 21 U.S.C.section 360bbb-3(b)(1), unless the authorization is terminated  or revoked sooner.       Influenza A by PCR NEGATIVE NEGATIVE Final   Influenza B by PCR NEGATIVE NEGATIVE Final    Comment: (NOTE) The Xpert Xpress SARS-CoV-2/FLU/RSV plus assay is intended as an aid in the diagnosis of influenza from Nasopharyngeal swab specimens and should not be used as a sole basis for treatment. Nasal washings and aspirates are unacceptable for Xpert Xpress  SARS-CoV-2/FLU/RSV testing.  Fact Sheet for Patients: BloggerCourse.com  Fact Sheet for Healthcare Providers: SeriousBroker.it  This test is not yet approved or cleared by the [place] FDA and has been authorized for detection and/or diagnosis of SARS-CoV-2 by FDA under an Emergency Use Authorization (  EUA). This EUA will remain in effect (meaning this test can be used) for the duration of the COVID-19 declaration under Section 564(b)(1) of the Act, 21 U.S.C. section 360bbb-3(b)(1), unless the authorization is terminated or revoked.  Performed at [facility], [address] [place], [place] [address]     RADIOLOGY:  CT Angio Chest Pulmonary Embolism (PE) W or WO Contrast  Result Date: 06/27/2021 CLINICAL DATA:  PE suspected, COPD, flu-like symptoms, productive cough EXAM: CT ANGIOGRAPHY CHEST WITH CONTRAST TECHNIQUE: Multidetector CT imaging of the chest was performed using the standard protocol during bolus administration of intravenous contrast. Multiplanar CT image reconstructions and MIPs were obtained to evaluate the vascular anatomy. CONTRAST:  43mL OMNIPAQUE IOHEXOL 350 MG/ML SOLN COMPARISON:  None. FINDINGS: Cardiovascular: Satisfactory opacification of the pulmonary arteries to the segmental level. No evidence of pulmonary embolism. Normal heart size. Left coronary artery calcifications. No pericardial effusion. Aortic atherosclerosis. Mediastinum/Nodes: No enlarged mediastinal, hilar, or axillary lymph nodes. Thyroid gland, trachea, and esophagus demonstrate no significant findings. Lungs/Pleura: Severe centrilobular emphysema. Mild, diffuse bilateral bronchial wall thickening. Bandlike scarring and consolidation of the lingula. No pleural effusion or pneumothorax. Upper Abdomen: No acute abnormality. Musculoskeletal: No chest wall abnormality. No acute or significant osseous findings. Review of the MIP images  confirms the above findings. IMPRESSION: 1. Negative examination for pulmonary embolism. 2. Severe emphysema and diffuse bilateral bronchial wall thickening. 3. Bandlike scarring and consolidation of the lingula, consistent with sequelae of prior infection or inflammation 4. Coronary artery disease. Aortic Atherosclerosis (ICD10-I70.0) and Emphysema (ICD10-J43.9). Electronically Signed   By: Jearld Lesch M.D.   On: 06/27/2021 11:19      Management plans discussed with the patient, family and they are in agreement.  CODE STATUS:     Code Status Orders  (From admission, onward)           Start     Ordered   06/23/21 2234  Full code  Continuous        06/23/21 2235           Code Status History     This patient has a current code status but no historical code status.       TOTAL TIME TAKING CARE OF THIS PATIENT: 32 minutes.    Alford Highland M.D on 06/28/2021 at 4:21 PM   [facility]  CC: Primary care physician; Laqueta Carina, MD

## 2021-09-21 ENCOUNTER — Ambulatory Visit: Payer: Self-pay

## 2024-06-18 ENCOUNTER — Other Ambulatory Visit: Payer: Self-pay

## 2024-06-18 ENCOUNTER — Inpatient Hospital Stay: Admission: EM | Admit: 2024-06-18 | Discharge: 2024-06-26 | DRG: 871 | Disposition: A | Discharge status: Home Health

## 2024-06-18 ENCOUNTER — Emergency Department

## 2024-06-18 DIAGNOSIS — Z7189 Other specified counseling: Secondary | ICD-10-CM | POA: Diagnosis not present

## 2024-06-18 DIAGNOSIS — R739 Hyperglycemia, unspecified: Secondary | ICD-10-CM | POA: Diagnosis present

## 2024-06-18 DIAGNOSIS — Z79899 Other long term (current) drug therapy: Secondary | ICD-10-CM

## 2024-06-18 DIAGNOSIS — E785 Hyperlipidemia, unspecified: Secondary | ICD-10-CM | POA: Diagnosis present

## 2024-06-18 DIAGNOSIS — J441 Chronic obstructive pulmonary disease with (acute) exacerbation: Secondary | ICD-10-CM | POA: Diagnosis present

## 2024-06-18 DIAGNOSIS — J9601 Acute respiratory failure with hypoxia: Principal | ICD-10-CM | POA: Diagnosis present

## 2024-06-18 DIAGNOSIS — J439 Emphysema, unspecified: Secondary | ICD-10-CM | POA: Diagnosis present

## 2024-06-18 DIAGNOSIS — I2489 Other forms of acute ischemic heart disease: Secondary | ICD-10-CM | POA: Diagnosis present

## 2024-06-18 DIAGNOSIS — Z87891 Personal history of nicotine dependence: Secondary | ICD-10-CM

## 2024-06-18 DIAGNOSIS — Z681 Body mass index (BMI) 19 or less, adult: Secondary | ICD-10-CM | POA: Diagnosis not present

## 2024-06-18 DIAGNOSIS — R7989 Other specified abnormal findings of blood chemistry: Secondary | ICD-10-CM | POA: Diagnosis not present

## 2024-06-18 DIAGNOSIS — I5A Non-ischemic myocardial injury (non-traumatic): Secondary | ICD-10-CM | POA: Diagnosis present

## 2024-06-18 DIAGNOSIS — J9621 Acute and chronic respiratory failure with hypoxia: Secondary | ICD-10-CM | POA: Diagnosis present

## 2024-06-18 DIAGNOSIS — B348 Other viral infections of unspecified site: Secondary | ICD-10-CM | POA: Insufficient documentation

## 2024-06-18 DIAGNOSIS — I5031 Acute diastolic (congestive) heart failure: Secondary | ICD-10-CM | POA: Diagnosis not present

## 2024-06-18 DIAGNOSIS — R000 Tachycardia, unspecified: Secondary | ICD-10-CM | POA: Diagnosis not present

## 2024-06-18 DIAGNOSIS — B953 Streptococcus pneumoniae as the cause of diseases classified elsewhere: Secondary | ICD-10-CM | POA: Diagnosis present

## 2024-06-18 DIAGNOSIS — L89892 Pressure ulcer of other site, stage 2: Secondary | ICD-10-CM | POA: Diagnosis not present

## 2024-06-18 DIAGNOSIS — F419 Anxiety disorder, unspecified: Secondary | ICD-10-CM | POA: Diagnosis present

## 2024-06-18 DIAGNOSIS — Z7982 Long term (current) use of aspirin: Secondary | ICD-10-CM | POA: Diagnosis not present

## 2024-06-18 DIAGNOSIS — Z1152 Encounter for screening for COVID-19: Secondary | ICD-10-CM

## 2024-06-18 DIAGNOSIS — E119 Type 2 diabetes mellitus without complications: Secondary | ICD-10-CM | POA: Diagnosis present

## 2024-06-18 DIAGNOSIS — R6520 Severe sepsis without septic shock: Secondary | ICD-10-CM | POA: Diagnosis present

## 2024-06-18 DIAGNOSIS — Z7951 Long term (current) use of inhaled steroids: Secondary | ICD-10-CM | POA: Diagnosis not present

## 2024-06-18 DIAGNOSIS — I25118 Atherosclerotic heart disease of native coronary artery with other forms of angina pectoris: Secondary | ICD-10-CM | POA: Diagnosis present

## 2024-06-18 DIAGNOSIS — L8990 Pressure ulcer of unspecified site, unspecified stage: Secondary | ICD-10-CM | POA: Insufficient documentation

## 2024-06-18 DIAGNOSIS — A419 Sepsis, unspecified organism: Principal | ICD-10-CM | POA: Diagnosis present

## 2024-06-18 DIAGNOSIS — E440 Moderate protein-calorie malnutrition: Secondary | ICD-10-CM | POA: Diagnosis present

## 2024-06-18 DIAGNOSIS — I214 Non-ST elevation (NSTEMI) myocardial infarction: Secondary | ICD-10-CM | POA: Diagnosis not present

## 2024-06-18 DIAGNOSIS — K5900 Constipation, unspecified: Secondary | ICD-10-CM | POA: Diagnosis not present

## 2024-06-18 DIAGNOSIS — B9789 Other viral agents as the cause of diseases classified elsewhere: Secondary | ICD-10-CM | POA: Diagnosis present

## 2024-06-18 DIAGNOSIS — J181 Lobar pneumonia, unspecified organism: Secondary | ICD-10-CM | POA: Diagnosis present

## 2024-06-18 DIAGNOSIS — E872 Acidosis, unspecified: Secondary | ICD-10-CM | POA: Diagnosis present

## 2024-06-18 DIAGNOSIS — J189 Pneumonia, unspecified organism: Secondary | ICD-10-CM | POA: Diagnosis not present

## 2024-06-18 DIAGNOSIS — K5909 Other constipation: Secondary | ICD-10-CM | POA: Diagnosis not present

## 2024-06-18 DIAGNOSIS — J440 Chronic obstructive pulmonary disease with acute lower respiratory infection: Secondary | ICD-10-CM | POA: Diagnosis present

## 2024-06-18 DIAGNOSIS — J209 Acute bronchitis, unspecified: Secondary | ICD-10-CM | POA: Diagnosis present

## 2024-06-18 DIAGNOSIS — R0603 Acute respiratory distress: Secondary | ICD-10-CM

## 2024-06-18 DIAGNOSIS — J9600 Acute respiratory failure, unspecified whether with hypoxia or hypercapnia: Secondary | ICD-10-CM | POA: Diagnosis not present

## 2024-06-18 DIAGNOSIS — Z515 Encounter for palliative care: Secondary | ICD-10-CM | POA: Diagnosis not present

## 2024-06-18 DIAGNOSIS — I2510 Atherosclerotic heart disease of native coronary artery without angina pectoris: Secondary | ICD-10-CM | POA: Diagnosis not present

## 2024-06-18 LAB — CBC WITH DIFFERENTIAL/PLATELET
Abs Immature Granulocytes: 0.17 K/uL — ABNORMAL HIGH (ref 0.00–0.07)
Basophils Absolute: 0.1 K/uL (ref 0.0–0.1)
Basophils Relative: 1 %
Eosinophils Absolute: 0.1 K/uL (ref 0.0–0.5)
Eosinophils Relative: 0 %
HCT: 38.3 % (ref 36.0–46.0)
Hemoglobin: 12.5 g/dL (ref 12.0–15.0)
Immature Granulocytes: 1 %
Lymphocytes Relative: 21 %
Lymphs Abs: 4.5 K/uL — ABNORMAL HIGH (ref 0.7–4.0)
MCH: 29.3 pg (ref 26.0–34.0)
MCHC: 32.6 g/dL (ref 30.0–36.0)
MCV: 89.9 fL (ref 80.0–100.0)
Monocytes Absolute: 1.6 K/uL — ABNORMAL HIGH (ref 0.1–1.0)
Monocytes Relative: 7 %
Neutro Abs: 14.9 K/uL — ABNORMAL HIGH (ref 1.7–7.7)
Neutrophils Relative %: 70 %
Platelets: 247 K/uL (ref 150–400)
RBC: 4.26 MIL/uL (ref 3.87–5.11)
RDW: 13.7 % (ref 11.5–15.5)
WBC: 21.3 K/uL — ABNORMAL HIGH (ref 4.0–10.5)
nRBC: 0 % (ref 0.0–0.2)

## 2024-06-18 LAB — BLOOD GAS, VENOUS
Acid-base deficit: 4 mmol/L — ABNORMAL HIGH (ref 0.0–2.0)
Bicarbonate: 23.4 mmol/L (ref 20.0–28.0)
O2 Saturation: 100 %
Patient temperature: 37
pCO2, Ven: 51 mmHg (ref 44–60)
pH, Ven: 7.27 (ref 7.25–7.43)
pO2, Ven: 171 mmHg — ABNORMAL HIGH (ref 32–45)

## 2024-06-18 LAB — COMPREHENSIVE METABOLIC PANEL WITH GFR
ALT: 37 U/L (ref 0–44)
AST: 38 U/L (ref 15–41)
Albumin: 4.2 g/dL (ref 3.5–5.0)
Alkaline Phosphatase: 89 U/L (ref 38–126)
Anion gap: 17 — ABNORMAL HIGH (ref 5–15)
BUN: 9 mg/dL (ref 8–23)
CO2: 21 mmol/L — ABNORMAL LOW (ref 22–32)
Calcium: 9.2 mg/dL (ref 8.9–10.3)
Chloride: 99 mmol/L (ref 98–111)
Creatinine, Ser: 0.67 mg/dL (ref 0.44–1.00)
GFR, Estimated: >60 mL/min (ref >60)
Glucose, Bld: 335 mg/dL — ABNORMAL HIGH (ref 70–99)
Potassium: 4.6 mmol/L (ref 3.5–5.1)
Sodium: 136 mmol/L (ref 135–145)
Total Bilirubin: 0.5 mg/dL (ref 0.0–1.2)
Total Protein: 7.6 g/dL (ref 6.5–8.1)

## 2024-06-18 LAB — TROPONIN T, HIGH SENSITIVITY
Troponin T High Sensitivity: 127 ng/L (ref 0–19)
Troponin T High Sensitivity: 278 ng/L (ref 0–19)

## 2024-06-18 LAB — APTT: aPTT: 36 s (ref 24–36)

## 2024-06-18 LAB — RESP PANEL BY RT-PCR (RSV, FLU A&B, COVID)  RVPGX2
Influenza A by PCR: NEGATIVE
Influenza B by PCR: NEGATIVE
Resp Syncytial Virus by PCR: NEGATIVE
SARS Coronavirus 2 by RT PCR: NEGATIVE

## 2024-06-18 LAB — PROTIME-INR
INR: 1 (ref 0.8–1.2)
Prothrombin Time: 13.6 s (ref 11.4–15.2)

## 2024-06-18 LAB — LACTIC ACID, PLASMA: Lactic Acid, Venous: 2.5 mmol/L (ref 0.5–1.9)

## 2024-06-18 LAB — PRO BRAIN NATRIURETIC PEPTIDE: Pro Brain Natriuretic Peptide: 659 pg/mL — ABNORMAL HIGH (ref <300.0)

## 2024-06-18 MED ORDER — SODIUM CHLORIDE 0.9 % IV BOLUS
500.0000 mL | Freq: Once | INTRAVENOUS | Status: AC
  Administered 2024-06-18: 500 mL via INTRAVENOUS

## 2024-06-18 MED ORDER — SIMVASTATIN 20 MG PO TABS
40.0000 mg | ORAL_TABLET | Freq: Every day | ORAL | Status: DC
  Administered 2024-06-20 – 2024-06-25 (×6): 40 mg via ORAL
  Filled 2024-06-18 (×6): qty 2

## 2024-06-18 MED ORDER — IPRATROPIUM-ALBUTEROL 0.5-2.5 (3) MG/3ML IN SOLN
3.0000 mL | Freq: Once | RESPIRATORY_TRACT | Status: AC
  Administered 2024-06-18: 3 mL via RESPIRATORY_TRACT
  Filled 2024-06-18: qty 3

## 2024-06-18 MED ORDER — SODIUM CHLORIDE 0.9 % IV SOLN
500.0000 mg | INTRAVENOUS | Status: DC
  Administered 2024-06-18 – 2024-06-21 (×4): 500 mg via INTRAVENOUS
  Filled 2024-06-18 (×5): qty 5

## 2024-06-18 MED ORDER — ALBUTEROL SULFATE (2.5 MG/3ML) 0.083% IN NEBU
2.5000 mg | INHALATION_SOLUTION | Freq: Four times a day (QID) | RESPIRATORY_TRACT | Status: DC | PRN
Start: 2024-06-18 — End: 2024-06-20

## 2024-06-18 MED ORDER — HEPARIN (PORCINE) 25000 UT/250ML-% IV SOLN
550.0000 [IU]/h | INTRAVENOUS | Status: DC
  Administered 2024-06-18: 550 [IU]/h via INTRAVENOUS
  Filled 2024-06-18: qty 250

## 2024-06-18 MED ORDER — SODIUM CHLORIDE 0.9 % IV SOLN
2.0000 g | INTRAVENOUS | Status: DC
  Administered 2024-06-19 – 2024-06-22 (×4): 2 g via INTRAVENOUS
  Filled 2024-06-18 (×4): qty 20

## 2024-06-18 MED ORDER — HEPARIN BOLUS VIA INFUSION
2500.0000 [IU] | Freq: Once | INTRAVENOUS | Status: AC
  Administered 2024-06-18: 2500 [IU] via INTRAVENOUS
  Filled 2024-06-18: qty 2500

## 2024-06-18 MED ORDER — ACETAMINOPHEN 10 MG/ML IV SOLN
1000.0000 mg | Freq: Four times a day (QID) | INTRAVENOUS | Status: AC
  Administered 2024-06-19 (×4): 1000 mg via INTRAVENOUS
  Filled 2024-06-18 (×5): qty 100

## 2024-06-18 MED ORDER — SODIUM CHLORIDE 0.9 % IV SOLN
500.0000 mg | Freq: Once | INTRAVENOUS | Status: DC
  Filled 2024-06-18: qty 5

## 2024-06-18 MED ORDER — ENOXAPARIN SODIUM 40 MG/0.4ML IJ SOSY
40.0000 mg | PREFILLED_SYRINGE | INTRAMUSCULAR | Status: DC
  Administered 2024-06-19: 40 mg via SUBCUTANEOUS
  Filled 2024-06-18: qty 0.4

## 2024-06-18 MED ORDER — SODIUM CHLORIDE 0.9 % IV SOLN
2.0000 g | Freq: Once | INTRAVENOUS | Status: AC
  Administered 2024-06-18: 2 g via INTRAVENOUS
  Filled 2024-06-18: qty 20

## 2024-06-18 NOTE — ED Provider Notes (Signed)
 [facility] Provider Note    Event Date/Time   First MD Initiated Contact with Patient 06/18/24 2012     (approximate)   History   Shortness of Breath   HPI  Gina Bates is a 63 y.o. female with history of COPD who comes in with shortness of breath.  Patient started feel short of breath today.  She had increased work of breathing and sats were in the 80s patient was given albuterol , magnesium, Solu-Medrol  and placed on CPAP.  Patient denies any history of blood clots any falls and hitting her head.  Family do report that she has a family member that she lives with that has also been sick with cough and so they were concerned about the potential of an infection.  They deny any history of blood clots or swelling her legs or any other symptoms associated with other than the shortness of breath.   Physical Exam   Triage Vital Signs: ED Triage Vitals [06/18/24 2012]  Encounter Vitals Group     BP      Girls Systolic BP Percentile      Girls Diastolic BP Percentile      Boys Systolic BP Percentile      Boys Diastolic BP Percentile      Pulse      Resp      Temp      Temp src      SpO2      Weight 96 lb 1.9 oz (43.6 kg)     Height 5' 1 (1.549 m)     Head Circumference      Peak Flow      Pain Score 0     Pain Loc      Pain Education      Exclude from Growth Chart     Most recent vital signs: Vitals:   06/18/24 2021 06/18/24 2030  BP: 128/76 135/68  Pulse: (!) 126 (!) 124  Resp: (!) 24 (!) 29  Temp: 98.3 F (36.8 C)   SpO2: 99% 99%     General: Awake, no distress.  CV:  Good peripheral perfusion.  Resp:  Increased work of breathing with tight air exchange no obvious wheezing Abd:  No distention.  Soft and nontender Other:  No swelling in legs.  No calf tenderness.   ED Results / Procedures / Treatments   Labs (all labs ordered are listed, but only abnormal results are displayed) Labs Reviewed  CBC WITH DIFFERENTIAL/PLATELET -  Abnormal; Notable for the following components:      Result Value   WBC 21.3 (*)    Neutro Abs 14.9 (*)    Lymphs Abs 4.5 (*)    Monocytes Absolute 1.6 (*)    Abs Immature Granulocytes 0.17 (*)    All other components within normal limits  COMPREHENSIVE METABOLIC PANEL WITH GFR - Abnormal; Notable for the following components:   CO2 21 (*)    Glucose, Bld 335 (*)    Anion gap 17 (*)    All other components within normal limits  PRO BRAIN NATRIURETIC PEPTIDE - Abnormal; Notable for the following components:   Pro Brain Natriuretic Peptide 659.0 (*)    All other components within normal limits  BLOOD GAS, VENOUS - Abnormal; Notable for the following components:   pO2, Ven 171 (*)    Acid-base deficit 4.0 (*)    All other components within normal limits  LACTIC ACID, PLASMA - Abnormal; Notable for the following components:  Lactic Acid, Venous 2.5 (*)    All other components within normal limits  TROPONIN T, HIGH SENSITIVITY - Abnormal; Notable for the following components:   Troponin T High Sensitivity 127 (*)    All other components within normal limits  TROPONIN T, HIGH SENSITIVITY - Abnormal; Notable for the following components:   Troponin T High Sensitivity 278 (*)    All other components within normal limits  RESP PANEL BY RT-PCR (RSV, FLU A&B, COVID)  RVPGX2  CULTURE, BLOOD (ROUTINE X 2)  CULTURE, BLOOD (ROUTINE X 2)  PROTIME-INR  APTT  URINALYSIS, ROUTINE W REFLEX MICROSCOPIC  HIV ANTIBODY (ROUTINE TESTING W REFLEX)  CBC  CREATININE, SERUM  COMPREHENSIVE METABOLIC PANEL WITH GFR  CBC  HEMOGLOBIN A1C  HEPARIN LEVEL (UNFRACTIONATED)     EKG  My interpretation of EKG:  Tachycardia rate 126, without any ST elevation or T wave versions, normal intervals  RADIOLOGY I have reviewed the xray personally and interpreted left lower lobe pneumonia   PROCEDURES:  Critical Care performed: Yes, see critical care procedure note(s)  .1-3 Lead EKG  Interpretation  Performed by: Ernest Ronal BRAVO, MD Authorized by: Ernest Ronal BRAVO, MD     Interpretation: abnormal     ECG rate:  120   ECG rate assessment: tachycardic     Rhythm: sinus tachycardia     Ectopy: none     Conduction: normal   .Critical Care  Performed by: Ernest Ronal BRAVO, MD Authorized by: Ernest Ronal BRAVO, MD   Critical care provider statement:    Critical care time (minutes):  30   Critical care was necessary to treat or prevent imminent or life-threatening deterioration of the following conditions:  Sepsis and respiratory failure   Critical care was time spent personally by me on the following activities:  Development of treatment plan with patient or surrogate, discussions with consultants, evaluation of patient's response to treatment, examination of patient, ordering and review of laboratory studies, ordering and review of radiographic studies, ordering and performing treatments and interventions, pulse oximetry, re-evaluation of patient's condition and review of old charts    MEDICATIONS ORDERED IN ED: Medications  acetaminophen (OFIRMEV) IV 1,000 mg (has no administration in time range)  heparin ADULT infusion 100 units/mL (25000 units/250mL) (550 Units/hr Intravenous New Bag/Given 06/18/24 2251)  simvastatin  (ZOCOR ) tablet 40 mg (has no administration in time range)  albuterol  (PROVENTIL ) (2.5 MG/3ML) 0.083% nebulizer solution 2.5 mg (has no administration in time range)  enoxaparin  (LOVENOX ) injection 40 mg (has no administration in time range)  cefTRIAXone  (ROCEPHIN ) 2 g in sodium chloride  0.9 % 100 mL IVPB (has no administration in time range)  azithromycin  (ZITHROMAX ) 500 mg in sodium chloride  0.9 % 250 mL IVPB (0 mg Intravenous Stopped 06/19/24 0000)  sodium chloride  0.9 % bolus 500 mL (500 mLs Intravenous New Bag/Given 06/18/24 2209)  cefTRIAXone  (ROCEPHIN ) 2 g in sodium chloride  0.9 % 100 mL IVPB (0 g Intravenous Stopped 06/18/24 2252)  ipratropium-albuterol   (DUONEB) 0.5-2.5 (3) MG/3ML nebulizer solution 3 mL (3 mLs Nebulization Given 06/18/24 2208)  heparin bolus via infusion 2,500 Units (2,500 Units Intravenous Bolus from Bag 06/18/24 2251)     IMPRESSION / MDM / ASSESSMENT AND PLAN / ED COURSE  I reviewed the triage vital signs and the nursing notes.   Patient's presentation is most consistent with acute presentation with potential threat to life or bodily function.   Differential includes CHF, COPD, pneumonia, COVID  Patient comes in with concerns for shortness of breath  history of COPD but no significant wheezing.  She is very tachycardic I will give 1 DuoNeb to see if this will help open her up.  Her x-ray is concerning for pneumonia so do suspect that this is the main cause and her VBG shows normal CO2 so no evidence of hypercapnia.  Patient is being treated for COPD exacerbation is possible that she has CHF given she does have elevated BNP and some slight pleural effusions I am going to give 500 cc of fluid to see if this helps improve heart rate but I do think patient would benefit from echocardiogram before doing additional fluids potentially.  Patient has already been on steroids and I have ordered antibiotics she meets sepsis criteria.  I considered the possibility of pulmonary embolism but she would be too unstable to undergo PET scan at this time with her significant work of breathing she has got no risk factors for PE no swelling in her legs to suggest DVT but given her elevated troponin I have started her on heparin as they deny any concerns for bleeding issues, recent falls or hitting her head or blood in her stools and so they were okay with proceeding with heparin.  I will discuss the hospital team for admission.  12:14 AM she is only gotten about 300 cc of the fluid but her heart rates have come down to 100 and her respiratory rate is improved.  The patient is on the cardiac monitor to evaluate for evidence of arrhythmia and/or  significant heart rate changes.      FINAL CLINICAL IMPRESSION(S) / ED DIAGNOSES   Final diagnoses:  COPD exacerbation (HCC)  Sepsis, due to unspecified organism, unspecified whether acute organ dysfunction present (HCC)  Pneumonia of left lower lobe due to infectious organism     Rx / DC Orders   ED Discharge Orders     None        Note:  This document was prepared using Dragon voice recognition software and may include unintentional dictation errors.   Ernest Ronal BRAVO, MD 06/19/24 [phone]

## 2024-06-18 NOTE — ED Notes (Signed)
 CCMD called to initiate cardiac monitoring.

## 2024-06-18 NOTE — ED Notes (Signed)
 Dr. Sim made aware of patient's troponin of 278 and lactic acid level of 2.5 at this time

## 2024-06-18 NOTE — H&P (Signed)
 History and Physical    Patient: Gina Bates FMW:[id number] DOB: 05/26/1961 DOA: 06/18/2024 DOS: the patient was seen and examined on 06/18/2024 PCP: Gina Rollene SAUNDERS, MD  Patient coming from: Home  Chief Complaint:  Chief Complaint  Patient presents with   Shortness of Breath   HPI: Gina Bates is a 63 y.o. female with medical history significant of COPD, elevated troponin history, who presents to the ER with sudden onset of shortness of breath and hypoxia.  Patient brought in by EMS on CPAP.  Oxygen sat was 80% on room air apparently when they arrived.  Patient has received albuterol  and Solu-Medrol .  Still having significant shortness of breath.  Patient is in the tripod position right now.  Workup in the ER showed suspected pneumonia but also fluid overload.  Suspected new CHF.  Patient has been placed on BiPAP.  Additionally troponin was noted to be more than 100.  No chest pain.  No peripheral edema.  Patient has been admitted with acute on chronic hypoxic respiratory failure.  Review of Systems: As mentioned in the history of present illness. All other systems reviewed and are negative. Past Medical History:  Diagnosis Date   COPD (chronic obstructive pulmonary disease) (HCC)    History reviewed. No pertinent surgical history. Social History:  reports that she has quit smoking. Her smoking use included cigarettes. She has never used smokeless tobacco. She reports that she does not currently use alcohol. She reports that she does not use drugs.  No Known Allergies  History reviewed. No pertinent family history.  Prior to Admission medications   Medication Sig Start Date End Date Taking? Authorizing Provider  albuterol  (PROVENTIL ) (2.5 MG/3ML) 0.083% nebulizer solution Take 3 mLs (2.5 mg total) by nebulization every 6 (six) hours as needed for wheezing or shortness of breath. 06/28/21  Yes Wieting, Richard, MD  albuterol  (VENTOLIN  HFA) 108 (90 Base) MCG/ACT inhaler Inhale 2  puffs into the lungs every 6 (six) hours as needed for shortness of breath. 06/28/21  Yes Wieting, Richard, MD  fluticasone-salmeterol (ADVAIR) 250-50 MCG/ACT AEPB Inhale 1 puff into the lungs 2 (two) times daily. 06/08/21  Yes [provider]  Multiple Vitamin (MULTIVITAMIN WITH MINERALS) TABS tablet Take 1 tablet by mouth daily. 06/29/21  Yes Wieting, Richard, MD  simvastatin  (ZOCOR ) 40 MG tablet Take 40 mg by mouth at bedtime. 05/30/21  Yes [provider]  SPIRIVA HANDIHALER 18 MCG inhalation capsule Place 1 capsule into inhaler and inhale in the morning. 06/08/21  Yes [provider]  Tiotropium Bromide (SPIRIVA HANDIHALER) 18 MCG CAPS Place 18 mcg into inhaler and inhale. 05/28/24 08/26/24 Yes [provider]    Physical Exam: Vitals:   06/18/24 2012 06/18/24 2021 06/18/24 2030  BP:  128/76 135/68  Pulse:  (!) 126 (!) 124  Resp:  (!) 24 (!) 29  Temp:  98.3 F (36.8 C)   TempSrc:  Axillary   SpO2:  99% 99%  Weight: 43.6 kg    Height: 5' 1 (1.549 m)     Constitutional: Acutely ill looking, NAD, calm, comfortable Eyes: PERRL, lids and conjunctivae normal ENMT: Mucous membranes are moist. Posterior pharynx clear of any exudate or lesions.Normal dentition.  Neck: normal, supple, no masses, no thyromegaly Respiratory: Patient in obvious respiratory distress, decreased air entry bilaterally, marked expiratory wheezing, Cardiovascular: Sinus tachycardia, no murmurs / rubs / gallops. No extremity edema. 2+ pedal pulses. No carotid bruits.  Abdomen: no tenderness, no masses palpated. No hepatosplenomegaly. Bowel sounds positive.  Musculoskeletal: Good range of motion, no joint swelling or tenderness, Skin: no rashes, lesions, ulcers. No induration Neurologic: CN 2-12 grossly intact. Sensation intact, DTR normal. Strength 5/5 in all 4.  Psychiatric: Normal judgment and insight. Alert and oriented x 3. Normal mood  Data Reviewed:  Temperature 98.3, blood  pressure 120/73, pulse 126 respirate 29 oxygen sat 99% on BiPAP FiO2 50%, glucose is 335, white count 21.3.  Troponin 127 acute viral screen is negative for flu, COVID-19 and RSV.  Chest x-ray showed hyperinflation of lungs and emphysema.  There is superimposed patchy infiltrate of the left lung base consistent with acute infection or aspiration.  Small bilateral pleural effusions  Assessment and Plan:  #1 acute hypoxic respiratory failure with hypoxia: Patient is on BiPAP now.  She is having significant respiratory distress.  Comfortable though on BiPAP.  Continue treatment for COPD exacerbation.  Consider CHF as an additional course.  Patient has pneumonia on chest x-ray.  We will address all 3.  #2 acute exacerbation of COPD: Initiate IV steroid, breathing treatment, add antibiotics.  #3 community-acquired pneumonia: Continue with antibiotics.  Blood cultures obtained.  #4 possible CHF: Will get echocardiogram.  Depending on the results may consider cardiology consultation  #5 hyperglycemia: Patient is not a known diabetic.  Will check hemoglobin A1c.  Blood sugar is more than 300.  If positive may consider treatment.  Hemoglobin A1c was 5.8 3 years ago.    Advance Care Planning:   Code Status: Full Code   Consults: None  Family Communication: Husband and son at baseline  Severity of Illness: The appropriate patient status for this patient is INPATIENT. Inpatient status is judged to be reasonable and necessary in order to provide the required intensity of service to ensure the patient's safety. The patient's presenting symptoms, physical exam findings, and initial radiographic and laboratory data in the context of their chronic comorbidities is felt to place them at high risk for further clinical deterioration. Furthermore, it is not anticipated that the patient will be medically stable for discharge from the hospital within 2 midnights of admission.   * I certify that at the point of  admission it is my clinical judgment that the patient will require inpatient hospital care spanning beyond 2 midnights from the point of admission due to high intensity of service, high risk for further deterioration and high frequency of surveillance required.*  AuthorBETHA SIM KNOLL, MD 06/18/2024 10:41 PM  For on call review [email or web address].

## 2024-06-18 NOTE — Consult Note (Signed)
 CODE SEPSIS - PHARMACY COMMUNICATION  **Broad Spectrum Antibiotics should be administered within 1 hour of Sepsis diagnosis**  Time Code Sepsis Called/Page Received: 2115  Antibiotics Ordered: azithromycin  and ceftriaxone   Time of 1st antibiotic administration: ceftriaxone  at 2210  Additional action taken by pharmacy: none  If necessary, Name of Provider/Nurse Contacted: n/a   Dorella Laster Rodriguez-Guzman PharmD, BCPS 06/18/2024 10:15 PM

## 2024-06-18 NOTE — Consult Note (Signed)
 PHARMACY - ANTICOAGULATION CONSULT NOTE  Pharmacy Consult for heparin  infusion Indication: chest pain/ACS  No Known Allergies  Patient Measurements: Height: 5' 1 (154.9 cm) Weight: 43.6 kg (96 lb 1.9 oz) IBW/kg (Calculated) : 47.8 HEPARIN  DW (KG): 43.6  Vital Signs: Temp: 98.3 F (36.8 C) (11/19 2021) Temp Source: Axillary (11/19 2021) BP: 135/68 (11/19 2030) Pulse Rate: 124 (11/19 2030)  Labs: Recent Labs    06/18/24 2024  HGB 12.5  HCT 38.3  PLT 247  CREATININE 0.67    Estimated Creatinine Clearance: 49.5 mL/min (by C-G formula based on SCr of 0.67 mg/dL).   Medical History: Past Medical History:  Diagnosis Date   COPD (chronic obstructive pulmonary disease) (HCC)     Medications:  NO AC prior to admission  Assessment: Patient admitted from home with shortness of breath and chest pain. PMH includes COPD, and hypercholesterolemia. No AC prior to admission with troponin 127. Pharmacy consulted to start and manage heparin  infusion for ACS.  Baseline hgb,and plt  appropriate to initiate heparin  infusion. LFTs within normal limits. Baseline aPTT and INR ordered  Goal of Therapy:  Heparin  level 0.3-0.7 units/ml Monitor platelets by anticoagulation protocol: Yes   Plan:  Give 2500 units bolus x 1 Start heparin  infusion at 550 units/hr Check heparin  level in 6 hours and daily while on heparin  Continue to monitor H&H and platelets  Gina Bates PharmD, BCPS 06/18/2024 9:54 PM

## 2024-06-18 NOTE — ED Notes (Signed)
 Family at bedside. Updated on plan of care.

## 2024-06-18 NOTE — ED Triage Notes (Signed)
 BIB [facility] from home with CC of SOB that started today - on CPAP on arrival. On scene pt was 80% on room air. EMS gave albuterol , 2g mag, and solumedrol. Pt tripoding on arrival. Respiratory in room and pt placed on BiPAP.

## 2024-06-19 ENCOUNTER — Inpatient Hospital Stay: Admit: 2024-06-19 | Discharge: 2024-06-19 | Disposition: A | Attending: Internal Medicine | Admitting: Internal Medicine

## 2024-06-19 ENCOUNTER — Inpatient Hospital Stay

## 2024-06-19 DIAGNOSIS — J9621 Acute and chronic respiratory failure with hypoxia: Secondary | ICD-10-CM | POA: Diagnosis not present

## 2024-06-19 DIAGNOSIS — J181 Lobar pneumonia, unspecified organism: Secondary | ICD-10-CM | POA: Diagnosis not present

## 2024-06-19 DIAGNOSIS — A419 Sepsis, unspecified organism: Secondary | ICD-10-CM | POA: Insufficient documentation

## 2024-06-19 DIAGNOSIS — R7989 Other specified abnormal findings of blood chemistry: Secondary | ICD-10-CM

## 2024-06-19 DIAGNOSIS — J441 Chronic obstructive pulmonary disease with (acute) exacerbation: Principal | ICD-10-CM | POA: Insufficient documentation

## 2024-06-19 DIAGNOSIS — J9600 Acute respiratory failure, unspecified whether with hypoxia or hypercapnia: Secondary | ICD-10-CM

## 2024-06-19 DIAGNOSIS — I25118 Atherosclerotic heart disease of native coronary artery with other forms of angina pectoris: Secondary | ICD-10-CM

## 2024-06-19 DIAGNOSIS — I5A Non-ischemic myocardial injury (non-traumatic): Secondary | ICD-10-CM | POA: Insufficient documentation

## 2024-06-19 DIAGNOSIS — R6520 Severe sepsis without septic shock: Secondary | ICD-10-CM

## 2024-06-19 DIAGNOSIS — E872 Acidosis, unspecified: Secondary | ICD-10-CM | POA: Insufficient documentation

## 2024-06-19 DIAGNOSIS — J9601 Acute respiratory failure with hypoxia: Secondary | ICD-10-CM | POA: Diagnosis not present

## 2024-06-19 DIAGNOSIS — R0603 Acute respiratory distress: Secondary | ICD-10-CM

## 2024-06-19 DIAGNOSIS — I5031 Acute diastolic (congestive) heart failure: Secondary | ICD-10-CM | POA: Diagnosis not present

## 2024-06-19 DIAGNOSIS — J189 Pneumonia, unspecified organism: Secondary | ICD-10-CM

## 2024-06-19 LAB — COMPREHENSIVE METABOLIC PANEL WITH GFR
ALT: 36 U/L (ref 0–44)
AST: 42 U/L — ABNORMAL HIGH (ref 15–41)
Albumin: 4 g/dL (ref 3.5–5.0)
Alkaline Phosphatase: 78 U/L (ref 38–126)
Anion gap: 14 (ref 5–15)
BUN: 12 mg/dL (ref 8–23)
CO2: 23 mmol/L (ref 22–32)
Calcium: 9.1 mg/dL (ref 8.9–10.3)
Chloride: 101 mmol/L (ref 98–111)
Creatinine, Ser: 0.6 mg/dL (ref 0.44–1.00)
GFR, Estimated: >60 mL/min (ref >60)
Glucose, Bld: 153 mg/dL — ABNORMAL HIGH (ref 70–99)
Potassium: 4.4 mmol/L (ref 3.5–5.1)
Sodium: 138 mmol/L (ref 135–145)
Total Bilirubin: 0.4 mg/dL (ref 0.0–1.2)
Total Protein: 7.3 g/dL (ref 6.5–8.1)

## 2024-06-19 LAB — CBC
HCT: 34.1 % — ABNORMAL LOW (ref 36.0–46.0)
Hemoglobin: 11.5 g/dL — ABNORMAL LOW (ref 12.0–15.0)
MCH: 29.4 pg (ref 26.0–34.0)
MCHC: 33.7 g/dL (ref 30.0–36.0)
MCV: 87.2 fL (ref 80.0–100.0)
Platelets: 201 K/uL (ref 150–400)
RBC: 3.91 MIL/uL (ref 3.87–5.11)
RDW: 13.7 % (ref 11.5–15.5)
WBC: 17.3 K/uL — ABNORMAL HIGH (ref 4.0–10.5)
nRBC: 0 % (ref 0.0–0.2)

## 2024-06-19 LAB — BLOOD CULTURE ID PANEL (REFLEXED) - BCID2

## 2024-06-19 LAB — URINALYSIS, ROUTINE W REFLEX MICROSCOPIC
Bacteria, UA: NONE SEEN
Bilirubin Urine: NEGATIVE
Glucose, UA: NEGATIVE mg/dL
Hgb urine dipstick: NEGATIVE
Ketones, ur: 5 mg/dL — AB
Leukocytes,Ua: NEGATIVE
Nitrite: NEGATIVE
Protein, ur: 30 mg/dL — AB
Specific Gravity, Urine: 1.028 (ref 1.005–1.030)
pH: 5 (ref 5.0–8.0)

## 2024-06-19 LAB — ECHOCARDIOGRAM COMPLETE
Height: 61 in
S' Lateral: 2.4 cm
Weight: 1537.93 [oz_av]

## 2024-06-19 LAB — HEPARIN LEVEL (UNFRACTIONATED)
Heparin Unfractionated: 1.01 [IU]/mL — ABNORMAL HIGH (ref 0.30–0.70)
Heparin Unfractionated: 0.41 [IU]/mL (ref 0.30–0.70)
Heparin Unfractionated: 0.25 [IU]/mL — ABNORMAL LOW (ref 0.30–0.70)

## 2024-06-19 LAB — STREP PNEUMONIAE URINARY ANTIGEN: Strep Pneumo Urinary Antigen: POSITIVE — AB

## 2024-06-19 LAB — LACTIC ACID, PLASMA
Lactic Acid, Venous: 1.6 mmol/L (ref 0.5–1.9)
Lactic Acid, Venous: 1 mmol/L (ref 0.5–1.9)

## 2024-06-19 LAB — HEMOGLOBIN A1C
Hgb A1c MFr Bld: 5.6 % (ref 4.8–5.6)
Mean Plasma Glucose: 114.02 mg/dL

## 2024-06-19 LAB — TROPONIN T, HIGH SENSITIVITY
Troponin T High Sensitivity: 178 ng/L (ref 0–19)
Troponin T High Sensitivity: 147 ng/L (ref 0–19)

## 2024-06-19 LAB — GLUCOSE, CAPILLARY: Glucose-Capillary: 152 mg/dL — ABNORMAL HIGH (ref 70–99)

## 2024-06-19 MED ORDER — IPRATROPIUM-ALBUTEROL 0.5-2.5 (3) MG/3ML IN SOLN
3.0000 mL | RESPIRATORY_TRACT | Status: DC
  Administered 2024-06-19 – 2024-06-20 (×8): 3 mL via RESPIRATORY_TRACT
  Filled 2024-06-19 (×8): qty 3

## 2024-06-19 MED ORDER — IPRATROPIUM-ALBUTEROL 0.5-2.5 (3) MG/3ML IN SOLN
3.0000 mL | Freq: Four times a day (QID) | RESPIRATORY_TRACT | Status: DC
  Administered 2024-06-19: 3 mL via RESPIRATORY_TRACT
  Filled 2024-06-19: qty 3

## 2024-06-19 MED ORDER — METHYLPREDNISOLONE SODIUM SUCC 40 MG IJ SOLR
40.0000 mg | Freq: Every day | INTRAMUSCULAR | Status: DC
  Administered 2024-06-19 – 2024-06-25 (×7): 40 mg via INTRAVENOUS
  Filled 2024-06-19 (×8): qty 1

## 2024-06-19 MED ORDER — DIAZEPAM 5 MG/ML IJ SOLN
2.5000 mg | INTRAMUSCULAR | Status: AC | PRN
  Administered 2024-06-20: 2.5 mg via INTRAVENOUS
  Filled 2024-06-19: qty 2

## 2024-06-19 MED ORDER — CHLORHEXIDINE GLUCONATE CLOTH 2 % EX PADS
6.0000 | MEDICATED_PAD | Freq: Every day | CUTANEOUS | Status: DC
  Administered 2024-06-19: 6 via TOPICAL

## 2024-06-19 MED ORDER — ASPIRIN 81 MG PO TBEC
81.0000 mg | DELAYED_RELEASE_TABLET | Freq: Every day | ORAL | Status: DC
  Administered 2024-06-19 – 2024-06-26 (×8): 81 mg via ORAL
  Filled 2024-06-19 (×8): qty 1

## 2024-06-19 MED ORDER — METHYLPREDNISOLONE SODIUM SUCC 40 MG IJ SOLR
40.0000 mg | Freq: Once | INTRAMUSCULAR | Status: AC
  Administered 2024-06-19: 40 mg via INTRAVENOUS

## 2024-06-19 MED ORDER — HEPARIN BOLUS VIA INFUSION
650.0000 [IU] | Freq: Once | INTRAVENOUS | Status: AC
  Administered 2024-06-19: 650 [IU] via INTRAVENOUS
  Filled 2024-06-19: qty 650

## 2024-06-19 MED ORDER — DIAZEPAM 5 MG/ML IJ SOLN
2.5000 mg | Freq: Once | INTRAMUSCULAR | Status: DC | PRN
  Filled 2024-06-19: qty 2

## 2024-06-19 MED ORDER — HEPARIN (PORCINE) 25000 UT/250ML-% IV SOLN
800.0000 [IU]/h | INTRAVENOUS | Status: DC
  Administered 2024-06-19: 550 [IU]/h via INTRAVENOUS

## 2024-06-19 MED ORDER — DIAZEPAM 5 MG/ML IJ SOLN
2.5000 mg | Freq: Once | INTRAMUSCULAR | Status: AC | PRN
  Administered 2024-06-19: 2.5 mg via INTRAVENOUS

## 2024-06-19 MED ORDER — ALPRAZOLAM 0.25 MG PO TABS
0.2500 mg | ORAL_TABLET | Freq: Two times a day (BID) | ORAL | Status: DC | PRN
  Administered 2024-06-19 – 2024-06-21 (×4): 0.25 mg via ORAL
  Filled 2024-06-19 (×4): qty 1

## 2024-06-19 NOTE — Inpatient Diabetes Management (Signed)
 Inpatient Diabetes Program Recommendations  AACE/ADA: New Consensus Statement on Inpatient Glycemic Control (2015)  Target Ranges:  Prepandial:   less than 140 mg/dL      Peak postprandial:   less than 180 mg/dL (1-2 hours)      Critically ill patients:  140 - 180 mg/dL   Lab Results  Component Value Date   HGBA1C 5.6 06/19/2024    Review of Glycemic Control  Latest Reference Range & Units 06/18/24 20:24 06/19/24 04:25  Glucose 70 - 99 mg/dL 664 (H) 846 (H)  (H): Data is abnormally high  Diabetes history: None Outpatient Diabetes medications: None Current orders for Inpatient glycemic control: Soul-medrol  40 mg QD  Inpatient Diabetes Program Recommendations:    Might consider cbg's ac/hs while on steroids.  If > 180 mg/dL start correction using sensitive scale.   Thank you, Wyvonna Pinal, MSN, CDCES Diabetes Coordinator Inpatient Diabetes Program [phone] (team pager from 8a-5p)

## 2024-06-19 NOTE — Assessment & Plan Note (Signed)
 Improved.

## 2024-06-19 NOTE — ED Notes (Signed)
 Family at bedside, recliner pillow and blanket provided. Patient expresses no needs at this time. No s/s of distress or discomfort. Call light within reach, safety precautions in place.

## 2024-06-19 NOTE — Hospital Course (Addendum)
 63 y.o. female with medical history significant of COPD, elevated troponin history, who presents to the ER with sudden onset of shortness of breath and hypoxia.  Patient brought in by EMS on CPAP.  Oxygen sat was 80% on room air apparently when they arrived.  Patient has received albuterol  and Solu-Medrol .  Still having significant shortness of breath.  Patient is in the tripod position right now.  Workup in the ER showed suspected pneumonia but also fluid overload.  Suspected new CHF.  Patient has been placed on BiPAP.  Additionally troponin was noted to be more than 100.  No chest pain.  No peripheral edema.  Patient has been admitted with acute on chronic hypoxic respiratory failure.   11/20.  Patient admitted with respiratory distress and placed on BiPAP.  We tried to take off BiPAP this morning but needed to go back on BiPAP secondary to shortness of breath.  Continue antibiotics for pneumonia and Solu-Medrol .  Patient initially placed on heparin  drip secondary to elevated troponin.  No complaints of chest pain. 11/21.  Patient required BiPAP and an extra dose of Solu-Medrol  last night.  Respiratory panel positive for rhinovirus.  Streptococcus pneumoniae urinary antigen positive.  Patient still feels short of breath.

## 2024-06-19 NOTE — Progress Notes (Signed)
 Progress Note   Patient: Gina Bates FMW:[id number] DOB: [birth year] DOA: 06/18/2024     1 DOS: the patient was seen and examined on 06/19/2024   Brief hospital course:  63 y.o. female with medical history significant of COPD, elevated troponin history, who presents to the ER with sudden onset of shortness of breath and hypoxia.  Patient brought in by EMS on CPAP.  Oxygen sat was 80% on room air apparently when they arrived.  Patient has received albuterol  and Solu-Medrol .  Still having significant shortness of breath.  Patient is in the tripod position right now.  Workup in the ER showed suspected pneumonia but also fluid overload.  Suspected new CHF.  Patient has been placed on BiPAP.  Additionally troponin was noted to be more than 100.  No chest pain.  No peripheral edema.  Patient has been admitted with acute on chronic hypoxic respiratory failure.   11/20.  Patient admitted with respiratory distress and placed on BiPAP.  We tried to take off BiPAP this morning but needed to go back on BiPAP secondary to shortness of breath.  Continue antibiotics for pneumonia and Solu-Medrol .  Patient initially placed on heparin drip secondary to elevated troponin.  No complaints of chest pain.  Assessment and Plan: * Acute respiratory distress Patient placed on BiPAP on presentation.  Acute on chronic respiratory failure with hypoxia [facility]) Patient requiring BiPAP on presentation.  Normally wears 2 L of oxygen.  Severe sepsis (HCC) Present on admission with tachycardia, tachypnea, leukocytosis and lobar pneumonia and lactic acidosis.  Continue Rocephin  and Zithromax .  Lobar pneumonia Continue Rocephin  and Zithromax .  1 bottle positive for staph species likely contamination.  COPD with acute exacerbation (HCC) Continue IV Solu-Medrol  nebulizer treatments.  Myocardial injury I suspect from respiratory distress pneumonia and COPD exacerbation.  Patient placed on heparin drip last night will ask  cardiology to see.  Will review aspirin.  Echocardiogram shows an EF of 50 to 55%.  Lactic acidosis Improved        Subjective: Patient coming in with shortness of breath and respiratory distress initially requiring BiPAP.  This morning had to go back on BiPAP secondary to worsening breathing when taken off.  Physical Exam: Vitals:   06/19/24 1200 06/19/24 1230 06/19/24 1300 06/19/24 1400  BP: 120/72 109/60 110/65 108/73  Pulse: (!) 115 (!) 111 (!) 102 (!) 101  Resp: (!) 30 20 19 19   Temp:   97.9 F (36.6 C)   TempSrc:   Axillary   SpO2: 100% 100% 100% 100%  Weight:      Height:       Physical Exam HENT:     Head: Normocephalic.     Mouth/Throat:     Pharynx: No oropharyngeal exudate.  Eyes:     General: Lids are normal.     Conjunctiva/sclera: Conjunctivae normal.  Cardiovascular:     Rate and Rhythm: Normal rate and regular rhythm.     Heart sounds: Normal heart sounds, S1 normal and S2 normal.  Pulmonary:     Breath sounds: Examination of the right-lower field reveals decreased breath sounds. Examination of the left-lower field reveals decreased breath sounds. Decreased breath sounds present. No wheezing, rhonchi or rales.  Abdominal:     Palpations: Abdomen is soft.     Tenderness: There is no abdominal tenderness.  Musculoskeletal:     Right lower leg: No swelling.     Left lower leg: No swelling.  Skin:    General: Skin is warm.  Findings: No rash.  Neurological:     Mental Status: She is alert and oriented to person, place, and time.     Data Reviewed: Last troponin 147, creatinine 0.6, electrolytes normal range, white blood cell count 17.3, hemoglobin 11.5, platelet count 201  Family Communication: Granddaughter at bedside  Disposition: Status is: Inpatient Remains inpatient appropriate because: Patient was on continuous BiPAP this morning.  See if we can taper off BiPAP.  Continue antibiotics for pneumonia/sepsis.  Continue IV steroids for COPD  exacerbation.  Planned Discharge Destination: HomeHome    Time spent: 28 minutes Discussed with cardiology and pulmonary  Author: Charlie Patterson, MD 06/19/2024 4:57 PM  For on call review [email or web address].

## 2024-06-19 NOTE — Progress Notes (Signed)
 PHARMACY - PHYSICIAN COMMUNICATION CRITICAL VALUE ALERT - BLOOD CULTURE IDENTIFICATION (BCID)  Gina Bates is an 63 y.o. female who presented to [facility] on 06/18/2024 with a chief complaint of shortness of breath.   Assessment: blood cultures from 11/19 with GPC in 1 of 4 bottles, BCID detects staphylococcus species (NOT S aureus)  Name of physician (or Provider) Contacted: Dr Josette  Current antibiotics: Ceftriaxone /azithromycin   Changes to prescribed antibiotics recommended:  No changes will monitor on current therapy, likely contaminant  Results for orders placed or performed during the hospital encounter of 06/18/24  Blood Culture ID Panel (Reflexed) (Collected: 06/18/2024  8:24 PM)  Result Value Ref Range   Enterococcus faecalis NOT DETECTED NOT DETECTED   Enterococcus Faecium NOT DETECTED NOT DETECTED   Listeria monocytogenes NOT DETECTED NOT DETECTED   Staphylococcus species DETECTED (A) NOT DETECTED   Staphylococcus aureus (BCID) NOT DETECTED NOT DETECTED   Staphylococcus epidermidis NOT DETECTED NOT DETECTED   Staphylococcus lugdunensis NOT DETECTED NOT DETECTED   Streptococcus species NOT DETECTED NOT DETECTED   Streptococcus agalactiae NOT DETECTED NOT DETECTED   Streptococcus pneumoniae NOT DETECTED NOT DETECTED   Streptococcus pyogenes NOT DETECTED NOT DETECTED   A.calcoaceticus-baumannii NOT DETECTED NOT DETECTED   Bacteroides fragilis NOT DETECTED NOT DETECTED   Enterobacterales NOT DETECTED NOT DETECTED   Enterobacter cloacae complex NOT DETECTED NOT DETECTED   Escherichia coli NOT DETECTED NOT DETECTED   Klebsiella aerogenes NOT DETECTED NOT DETECTED   Klebsiella oxytoca NOT DETECTED NOT DETECTED   Klebsiella pneumoniae NOT DETECTED NOT DETECTED   Proteus species NOT DETECTED NOT DETECTED   Salmonella species NOT DETECTED NOT DETECTED   Serratia marcescens NOT DETECTED NOT DETECTED   Haemophilus influenzae NOT DETECTED NOT DETECTED   Neisseria  meningitidis NOT DETECTED NOT DETECTED   Pseudomonas aeruginosa NOT DETECTED NOT DETECTED   Stenotrophomonas maltophilia NOT DETECTED NOT DETECTED   Candida albicans NOT DETECTED NOT DETECTED   Candida auris NOT DETECTED NOT DETECTED   Candida glabrata NOT DETECTED NOT DETECTED   Candida krusei NOT DETECTED NOT DETECTED   Candida parapsilosis NOT DETECTED NOT DETECTED   Candida tropicalis NOT DETECTED NOT DETECTED   Cryptococcus neoformans/gattii NOT DETECTED NOT DETECTED    Celestine Slovak, PharmD, BCPS, BCIDP Work Cell: [phone] 06/19/2024 2:22 PM

## 2024-06-19 NOTE — Consult Note (Signed)
 PHARMACY - ANTICOAGULATION CONSULT NOTE  Pharmacy Consult for heparin  infusion Indication: chest pain/ACS  No Known Allergies  Patient Measurements: Height: 5' 1 (154.9 cm) Weight: 43.6 kg (96 lb 1.9 oz) IBW/kg (Calculated) : 47.8 HEPARIN  DW (KG): 43.6  Vital Signs: Temp: 98 F (36.7 C) (11/20 1700) Temp Source: Axillary (11/20 1700) BP: 115/66 (11/20 1800) Pulse Rate: 113 (11/20 1800)  Labs: Recent Labs    06/18/24 2024 06/19/24 0425 06/19/24 1146 06/19/24 1754  HGB 12.5 11.5*  --   --   HCT 38.3 34.1*  --   --   PLT 247 201  --   --   APTT 36  --   --   --   LABPROT 13.6  --   --   --   INR 1.0  --   --   --   HEPARINUNFRC  --  1.01* 0.41 0.25*  CREATININE 0.67 0.60  --   --     Estimated Creatinine Clearance: 49.5 mL/min (by C-G formula based on SCr of 0.6 mg/dL).   Medical History: Past Medical History:  Diagnosis Date   COPD (chronic obstructive pulmonary disease) (HCC)     Medications:  NO AC prior to admission  Assessment: Patient admitted from home with shortness of breath and chest pain. PMH includes COPD, and hypercholesterolemia. No AC prior to admission with troponin 127. Pharmacy consulted to start and manage heparin  infusion for ACS.  Baseline hgb,and plt  appropriate to initiate heparin  infusion. LFTs within normal limits. Baseline aPTT and INR ordered  11/20@1146 : HL 0.41, therapeutic x 1 11/20@1754 : HL 0.25, subtherapeutic  Goal of Therapy:  Heparin  level 0.3-0.7 units/ml Monitor platelets by anticoagulation protocol: Yes   Plan:  - heparin  level subtherapeutic - give 650 unit bolus x 1 - increase heparin  infusion to 650 units/hr - check confirmatory HL in 6 hours - Continue to monitor H&H and platelets  Lum VEAR Mania, PharmD, BCPS 06/19/2024 6:42 PM

## 2024-06-19 NOTE — Assessment & Plan Note (Addendum)
 Present on admission with tachycardia, tachypnea, leukocytosis and lobar pneumonia and lactic acidosis.  Continue Rocephin  and Zithromax .  Respiratory viral panel positive for rhinovirus.  Urinary Streptococcus pneumoniae antigen positive.

## 2024-06-19 NOTE — Consult Note (Signed)
 PHARMACY - ANTICOAGULATION CONSULT NOTE  Pharmacy Consult for heparin infusion Indication: chest pain/ACS  No Known Allergies  Patient Measurements: Height: 5' 1 (154.9 cm) Weight: 43.6 kg (96 lb 1.9 oz) IBW/kg (Calculated) : 47.8 HEPARIN DW (KG): 43.6  Vital Signs: Temp: 98.4 F (36.9 C) (11/20 0900) Temp Source: Axillary (11/20 0900) BP: 112/69 (11/20 0900) Pulse Rate: 106 (11/20 0900)  Labs: Recent Labs    06/18/24 2024 06/19/24 0425 06/19/24 1146  HGB 12.5 11.5*  --   HCT 38.3 34.1*  --   PLT 247 201  --   APTT 36  --   --   LABPROT 13.6  --   --   INR 1.0  --   --   HEPARINUNFRC  --  1.01* 0.41  CREATININE 0.67 0.60  --     Estimated Creatinine Clearance: 49.5 mL/min (by C-G formula based on SCr of 0.6 mg/dL).   Medical History: Past Medical History:  Diagnosis Date   COPD (chronic obstructive pulmonary disease) (HCC)     Medications:  NO AC prior to admission  Assessment: Patient admitted from home with shortness of breath and chest pain. PMH includes COPD, and hypercholesterolemia. No AC prior to admission with troponin 127. Pharmacy consulted to start and manage heparin infusion for ACS.  Baseline hgb,and plt  appropriate to initiate heparin infusion. LFTs within normal limits. Baseline aPTT and INR ordered  Goal of Therapy:  Heparin level 0.3-0.7 units/ml Monitor platelets by anticoagulation protocol: Yes   Plan:  11/20@1146 : HL 0.41, therapeutic x 1 - continue heparin infusion at 550 units/hr - check confirmatory HL in 6  - Continue to monitor H&H and platelets  Link Burgeson A Africa Masaki 06/19/2024 12:59 PM

## 2024-06-19 NOTE — Consult Note (Signed)
 Cardiology Consultation   Patient ID: Gina Bates MRN: [id number]; DOB: [birth year]  Admit date: 06/18/2024 Date of Consult: 06/19/2024  PCP:  Kerry Rollene SAUNDERS, MD   [facility] Providers Cardiologist: new to [facility] Physician requesting consult: Dr.Wieting Reason for consult: Elevated troponin  Patient Profile: Gina Bates is a 63 y.o. female with a hx of COPD, smoker, hyperlipidemia presenting to the emergency room June 18, 2024 with worsening shortness of breath, hypoxia Cardiology consulted for elevated troponin  History of Present Illness: Ms. Gina Bates reports shortness of breath starting yesterday, called 911, saturations 80% on room air at the scene receiving steroids, albuterol  inhaler, was transition from CPAP to BiPAP in the ER  Notes indicating other family member has been sick with cough, granddaughter, had exposure last week Relatively acute onset shortness of breath presenting yesterday At baseline per the son at the bedside she has chronic cough Cough and breathing is worse than her baseline Some sputum production Elevated heart rate in the ER rates in the 120 range, WBC 21 BNP 659 Troponin 127, repeat 278, repeat 178 Started on broad-spectrum antibiotics in the ER Chest x-ray with hyperinflation of lungs, emphysema, patchy infiltrate left lung base concerning for infection/aspiration, small bilateral pleural effusions  Heparin infusion running   Past Medical History:  Diagnosis Date   COPD (chronic obstructive pulmonary disease) (HCC)     History reviewed. No pertinent surgical history.   Home Medications:  Prior to Admission medications   Medication Sig Start Date End Date Taking? Authorizing Provider  albuterol  (PROVENTIL ) (2.5 MG/3ML) 0.083% nebulizer solution Take 3 mLs (2.5 mg total) by nebulization every 6 (six) hours as needed for wheezing or shortness of breath. 06/28/21  Yes Wieting, Richard, MD  albuterol  (VENTOLIN  HFA) 108  (90 Base) MCG/ACT inhaler Inhale 2 puffs into the lungs every 6 (six) hours as needed for shortness of breath. 06/28/21  Yes Wieting, Richard, MD  fluticasone-salmeterol (ADVAIR) 250-50 MCG/ACT AEPB Inhale 1 puff into the lungs 2 (two) times daily. 06/08/21  Yes [provider]  Multiple Vitamin (MULTIVITAMIN WITH MINERALS) TABS tablet Take 1 tablet by mouth daily. 06/29/21  Yes Wieting, Richard, MD  simvastatin  (ZOCOR ) 40 MG tablet Take 40 mg by mouth at bedtime. 05/30/21  Yes [provider]  SPIRIVA HANDIHALER 18 MCG inhalation capsule Place 1 capsule into inhaler and inhale in the morning. 06/08/21  Yes [provider]  Tiotropium Bromide (SPIRIVA HANDIHALER) 18 MCG CAPS Place 18 mcg into inhaler and inhale. 05/28/24 08/26/24 Yes [provider]    Scheduled Meds:  aspirin EC  81 mg Oral Daily   ipratropium-albuterol   3 mL Nebulization Q4H   methylPREDNISolone  (SOLU-MEDROL ) injection  40 mg Intravenous Daily   simvastatin   40 mg Oral QHS   Continuous Infusions:  acetaminophen Stopped (06/19/24 1246)   azithromycin  Stopped (06/19/24 0000)   cefTRIAXone  (ROCEPHIN )  IV     heparin 550 Units/hr (06/19/24 0624)   PRN Meds: albuterol , ALPRAZolam  Allergies:   No Known Allergies  Social History:   Social History   Socioeconomic History   Marital status: Married    Spouse name: Not on file   Number of children: Not on file   Years of education: Not on file   Highest education level: Not on file  Occupational History   Not on file  Tobacco Use   Smoking status: Former    Types: Cigarettes   Smokeless tobacco: Never  Substance and Sexual Activity   Alcohol use: Not Currently  Drug use: Never   Sexual activity: Not on file  Other Topics Concern   Not on file  Social History Narrative   Not on file   Social Drivers of Health   Financial Resource Strain: Low Risk (05/28/2024)   Received from [facility]   Overall Financial Resource  Strain (CARDIA)    How hard is it for you to pay for the very basics like food, housing, medical care, and heating?: Not hard at all  Food Insecurity: No Food Insecurity (05/28/2024)   Received from [facility]   Hunger Vital Sign    Within the past 12 months, you worried that your food would run out before you got the money to buy more.: Never true    Within the past 12 months, the food you bought just didn't last and you didn't have money to get more.: Never true  Transportation Needs: No Transportation Needs (05/28/2024)   Received from [facility] - Transportation    Lack of Transportation (Medical): No    Lack of Transportation (Non-Medical): No  Physical Activity: Not on file  Stress: Not on file  Social Connections: Not on file  Intimate Partner Violence: Not At Risk (02/17/2021)   Received from [facility]   Humiliation, Afraid, Rape, and Kick questionnaire    Within the last year, have you been afraid of your partner or ex-partner?: No    Within the last year, have you been humiliated or emotionally abused in other ways by your partner or ex-partner?: No    Within the last year, have you been kicked, hit, slapped, or otherwise physically hurt by your partner or ex-partner?: No    Within the last year, have you been raped or forced to have any kind of sexual activity by your partner or ex-partner?: No    Family History:   History reviewed. No pertinent family history.   ROS:  Please see the history of present illness.  Review of Systems  Constitutional: Negative.   HENT: Negative.    Respiratory:  Positive for shortness of breath.   Cardiovascular: Negative.   Gastrointestinal: Negative.   Musculoskeletal: Negative.   Neurological: Negative.   Psychiatric/Behavioral: Negative.    All other systems reviewed and are negative.   Physical Exam/Data: Vitals:   06/19/24 1200 06/19/24 1230 06/19/24 1300 06/19/24 1400  BP: 120/72 109/60 110/65 108/73   Pulse: (!) 115 (!) 111 (!) 102 (!) 101  Resp: (!) 30 20 19 19   Temp:   97.9 F (36.6 C)   TempSrc:   Axillary   SpO2: 100% 100% 100% 100%  Weight:      Height:        Intake/Output Summary (Last 24 hours) at 06/19/2024 1428 Last data filed at 06/19/2024 1152 Gross per 24 hour  Intake --  Output 300 ml  Net -300 ml      06/18/2024    8:12 PM 06/24/2021    3:02 AM  Last 3 Weights  Weight (lbs) 96 lb 1.9 oz 107 lb  Weight (kg) 43.6 kg 48.535 kg     Body mass index is 18.16 kg/m.  General:  Well nourished, well developed, in no acute distress HEENT: normal Neck: no JVD Vascular: No carotid bruits; Distal pulses 2+ bilaterally Cardiac:  normal S1, S2; RRR; no murmur Lungs: Decreased breath sounds throughout, scattered Rales Abd: soft, nontender, no hepatomegaly  Ext: no edema Musculoskeletal:  No deformities, BUE and BLE strength normal  and equal Skin: warm and dry  Neuro:  CNs 2-12 intact, no focal abnormalities noted Psych:  Normal affect   EKG:  The EKG was personally reviewed and demonstrates: Sinus tachycardia rate 126 bpm unable to exclude old anterior MI  Telemetry:  Telemetry was personally reviewed and demonstrates: Sinus tachycardia  Relevant CV Studies:   Laboratory Data: High Sensitivity Troponin:  No results for input(s): TROPONINIHS in the last 720 hours.   Chemistry Recent Labs  Lab 06/18/24 2024 06/19/24 0425  NA 136 138  K 4.6 4.4  CL 99 101  CO2 21* 23  GLUCOSE 335* 153*  BUN 9 12  CREATININE 0.67 0.60  CALCIUM 9.2 9.1  GFRNONAA >60 >60  ANIONGAP 17* 14    Recent Labs  Lab 06/18/24 2024 06/19/24 0425  PROT 7.6 7.3  ALBUMIN 4.2 4.0  AST 38 42*  ALT 37 36  ALKPHOS 89 78  BILITOT 0.5 0.4   Lipids No results for input(s): CHOL, TRIG, HDL, LABVLDL, LDLCALC, CHOLHDL in the last 168 hours.  Hematology Recent Labs  Lab 06/18/24 2024 06/19/24 0425  WBC 21.3* 17.3*  RBC 4.26 3.91  HGB 12.5 11.5*  HCT 38.3 34.1*   MCV 89.9 87.2  MCH 29.3 29.4  MCHC 32.6 33.7  RDW 13.7 13.7  PLT 247 201   Thyroid No results for input(s): TSH, FREET4 in the last 168 hours.  BNP Recent Labs  Lab 06/18/24 2024  PROBNP 659.0*    DDimer No results for input(s): DDIMER in the last 168 hours.  Radiology/Studies:  DG Chest Portable 1 View Result Date: 06/18/2024 EXAM: 1 VIEW(S) XRAY OF THE CHEST 06/18/2024 08:40:00 PM COMPARISON: None available. CLINICAL HISTORY: sob FINDINGS: LUNGS AND PLEURA: Hyperinflation of lungs. Emphysema. Superimposed patchy infiltrate of the left lung base in keeping with acute infection or aspiration. Small bilateral pleural effusions. No pneumothorax. HEART AND MEDIASTINUM: Aortic calcification. No acute abnormality of the cardiac and mediastinal silhouettes. BONES AND SOFT TISSUES: No acute osseous abnormality. IMPRESSION: 1. Hyperinflation of lungs and emphysema. 2. Superimposed patchy infiltrate of the left lung base, consistent with acute infection or aspiration. 3. Small bilateral pleural effusions. Electronically signed by: Dorethia Molt MD 06/18/2024 08:52 PM EST RP Workstation: [id number]     Assessment and Plan: Elevated troponin Likely demand ischemia in the setting of hypoxia, tachycardia Previously noted coronary calcifications on CT scan November 2022 - Echo with no focal wall motion abnormality EF 50 to 55% - heparin infusion was initiated early this morning - This can likely be discontinued after 48 hours - Continue aspirin, statin Avoid beta-blocker given risk of bronchospasm, blood pressure running low - Follow-up as outpatient, for any symptoms concerning for angina could consider stress testing after COPD exacerbation resolves  Shortness of breath/acute on chronic respiratory distress COPD exacerbation, possible pneumonia Long history of smoking, COPD On antibiotics, steroids -Echocardiogram with low normal ejection fraction 50 to 55%, no indication of elevated  right heart pressures, IVC is not dilated - Mucomyst, steroids, broad-spectrum antibiotics, nebulizers every 6  Diabetes type 2 A1c well-controlled 5.6  Coronary calcification Previously noted on CT scan 2022 Management of coronary disease as detailed above   For questions or updates, please contact [facility] Please consult [email or web address] for contact info under   Signed, Bostyn Kunkler, MD  06/19/2024 2:28 PM

## 2024-06-19 NOTE — Assessment & Plan Note (Addendum)
 Continue IV Solu-Medrol  and nebulizer treatments.

## 2024-06-19 NOTE — ED Notes (Signed)
 Introduced self to pt and granddaughter at bedside. Pt denies pain and has no further needs at this time. Call bell in reach. Stretcher in lowest position and locked.

## 2024-06-19 NOTE — Assessment & Plan Note (Addendum)
 Continue Rocephin  and Zithromax .  1 bottle positive for staph hominis which is likely contamination.  Urinary Streptococcus pneumoniae antigen positive.

## 2024-06-19 NOTE — Assessment & Plan Note (Addendum)
 Patient requiring BiPAP on presentation.  This morning on heated high flow nasal cannula 39% oxygen and 35 L flow.  Patient switch over to 6 L bubble high flow nasal cannula today.  Normally wears 2 L of oxygen.

## 2024-06-19 NOTE — Consult Note (Signed)
 NAME:  Gina Bates, MRN:  [id number], DOB:  14-Jan-1961, LOS: 1 ADMISSION DATE:  06/18/2024, CONSULTATION DATE:  06/19/2024 REFERRING MD:  Dr. Josette, CHIEF COMPLAINT:  Acute Hypoxic Respiratory Failure    Brief Pt Description / Synopsis:  63 y.o. female with PMHx significant for COPD admitted with Acute on Chronic Hypoxic Respiratory Failure in the setting of AECOPD and Community Acquired Pneumonia requiring BiPAP.   History of Present Illness:  Gina Bates is a 63 y.o. female with a past medical history significant for COPD who presented to [facility] ED on 06/18/2024 due to complaints of sudden onset of shortness of breath and hypoxia.    On EMS arrival she was found to be hypoxia with O2 sts of 80% on room air, of which she was given albuterol  and Solumedrol, and placed on CPAP.  Upon arrival to the ED she was still exhibiting significant shortness of breath and in the tripod position, of which she was placed on BiPAP.  She denied peripheral edema.   ED Course: Initial Vital Signs: Temperature 98.3, blood pressure 120/73, pulse 126 respirate 29 oxygen sat 99% on BiPAP FiO2 50%  Significant Labs: WBC 21.3, glucose 335, Pro BNP 659, HS Troponin 127, lactic acid 2.5 VBG: pH 7.27/pO2 171/pCO2 51/ Bicarb 23.4 COVID/FLU/RSV PCR negative  Imaging Chest X-ray>>IMPRESSION: 1. Hyperinflation of lungs and emphysema. 2. Superimposed patchy infiltrate of the left lung base, consistent with acute infection or aspiration. 3. Small bilateral pleural effusions. Medications Administered:   [facility] asked to admit for further workup and treatment.  Please see Significant Hospital Events section below for full detailed hospital course.   Pertinent  Medical History   Past Medical History:  Diagnosis Date   COPD (chronic obstructive pulmonary disease) (HCC)     Micro Data:  11/19: Blood cultures x2>> 1/4 bottles with Staphylococcus species (? Contaminant ) 11/19: COVID/FLU/RSV PCR>>  negative 11/20: RVP>> 11/20: Sputum>> 11/20: Strep pneumo urinary antigen>> 11/20: Legionella urinary antigen>>  Antimicrobials:   Anti-infectives (From admission, onward)    Start     Dose/Rate Route Frequency Ordered Stop   06/19/24 2200  cefTRIAXone  (ROCEPHIN ) 2 g in sodium chloride  0.9 % 100 mL IVPB        2 g 200 mL/hr over 30 Minutes Intravenous Every 24 hours 06/18/24 2241 06/24/24 2159   06/18/24 2245  azithromycin  (ZITHROMAX ) 500 mg in sodium chloride  0.9 % 250 mL IVPB        500 mg 250 mL/hr over 60 Minutes Intravenous Every 24 hours 06/18/24 2241 06/23/24 2244   06/18/24 2130  cefTRIAXone  (ROCEPHIN ) 2 g in sodium chloride  0.9 % 100 mL IVPB        2 g 200 mL/hr over 30 Minutes Intravenous Once 06/18/24 2115 06/18/24 2252   06/18/24 2130  azithromycin  (ZITHROMAX ) 500 mg in sodium chloride  0.9 % 250 mL IVPB  Status:  Discontinued        500 mg 250 mL/hr over 60 Minutes Intravenous  Once 06/18/24 2115 06/18/24 2245       Significant Hospital Events: Including procedures, antibiotic start and stop dates in addition to other pertinent events   11/19: Admitted by TR[facility] for Acute on Chronic Hypoxic Respiratory Failure due to AECOPD, pneumonia, and questionable CHF requiring BiPAP. 11/20: Remains on BiPAP, PCCM and Cardiology consulted.   Interim History / Subjective:  As outlined above under Significant Hospital Events section  Objective   Blood pressure 112/69, pulse (!) 106, temperature 98.4 F (36.9 C), temperature source Axillary,  resp. rate 20, height 5' 1 (1.549 m), weight 43.6 kg, SpO2 100%.    FiO2 (%):  [30 %-50 %] 40 % PEEP:  [5 cmH20] 5 cmH20 Pressure Support:  [5 cmH20] 5 cmH20  No intake or output data in the 24 hours ending 06/19/24 1106 Filed Weights   06/18/24 2012  Weight: 43.6 kg    Examination: General: Acutely ill appearing female. Laying in bed, on BiPAP, in NAD HENT: Atraumatic, normocephalic, neck supple, no JVD Lungs: Clear breath sounds  throughout, BiPAP assisted, even Cardiovascular: RRR, s1s2, no M/R/G Abdomen: Soft, nontender, nondistended, no guarding or rebound tenderness, BS+ x4 Extremities: Normal bulk and tone, no deformities, no edema Neuro: Awake and alert, oriented x4, moves all extremities purposefully, no focal deficits noted GU: deferred, voiding   Resolved Hospital Problem list     Assessment & Plan:   #Acute on Chronic Hypoxic Respiratory Failure #Acute COPD Exacerbation #Community Acquired Pneumonia  PMHx: COPD, prescribed for 2L supplemental Oxygen -Supplemental O2 as needed to maintain O2 sats 88 to 92% -BiPAP, wean as tolerated -Follow intermittent Chest X-ray & ABG as needed -Bronchodilators  -IV Steroids -ABX as above -Pulmonary toilet as able  #Elevated Troponin due to demand ischemia -Echocardiogram 06/19/24: LVEF 50-55%, indeterminate diastolic parameters, RV systolic function and RV size both normal -Continuous cardiac monitoring -Maintain MAP >65 -Vasopressors as needed to maintain MAP goal ~ not requiring  -Lactic acid has normalized -HS Troponin peaked at 278 -Cardiology consulted, appreciate input ~ continue Heparin for 48 hrs   #Met SIRS Criteria on presentation (Pulse 125, RR 24, WBC 21.3) #Sepsis due to ... #Community Acquired Pneumonia -Monitor fever curve -Trend WBC's & Procalcitonin -Follow cultures as above -Continue empiric Azithromycin  & Ceftriaxone  pending cultures & sensitivities      Best Practice (right click and Reselect all SmartList Selections daily)   Diet/type: NPO until maintaining off BiPAP  DVT prophylaxis: systemic heparin GI prophylaxis: N/A Lines: N/A Foley:  N/A Code Status:  full code Last date of multidisciplinary goals of care discussion [11/20]  11/20: Pt and her son updated at bedside on plan of care.  Labs   CBC: Recent Labs  Lab 06/18/24 2024 06/19/24 0425  WBC 21.3* 17.3*  NEUTROABS 14.9*  --   HGB 12.5 11.5*  HCT 38.3  34.1*  MCV 89.9 87.2  PLT 247 201    Basic Metabolic Panel: Recent Labs  Lab 06/18/24 2024 06/19/24 0425  NA 136 138  K 4.6 4.4  CL 99 101  CO2 21* 23  GLUCOSE 335* 153*  BUN 9 12  CREATININE 0.67 0.60  CALCIUM 9.2 9.1   GFR: Estimated Creatinine Clearance: 49.5 mL/min (by C-G formula based on SCr of 0.6 mg/dL). Recent Labs  Lab 06/18/24 2024 06/18/24 2256 06/19/24 0425  WBC 21.3*  --  17.3*  LATICACIDVEN  --  2.5*  --     Liver Function Tests: Recent Labs  Lab 06/18/24 2024 06/19/24 0425  AST 38 42*  ALT 37 36  ALKPHOS 89 78  BILITOT 0.5 0.4  PROT 7.6 7.3  ALBUMIN 4.2 4.0   No results for input(s): LIPASE, AMYLASE in the last 168 hours. No results for input(s): AMMONIA in the last 168 hours.  ABG    Component Value Date/Time   HCO3 23.4 06/18/2024 2024   ACIDBASEDEF 4.0 (H) 06/18/2024 2024   O2SAT 100 06/18/2024 2024     Coagulation Profile: Recent Labs  Lab 06/18/24 2024  INR 1.0    Cardiac  Enzymes: No results for input(s): CKTOTAL, CKMB, CKMBINDEX, TROPONINI in the last 168 hours.  HbA1C: Hgb A1c MFr Bld  Date/Time Value Ref Range Status  06/19/2024 04:25 AM 5.6 4.8 - 5.6 % Final    Comment:    (NOTE) Diagnosis of Diabetes The following HbA1c ranges recommended by the American Diabetes Association (ADA) may be used as an aid in the diagnosis of diabetes mellitus.  Hemoglobin             Suggested A1C NGSP%              Diagnosis  <5.7                   Non Diabetic  5.7-6.4                Pre-Diabetic  >6.4                   Diabetic  <7.0                   Glycemic control for                       adults with diabetes.    06/24/2021 06:13 AM 5.8 (H) 4.8 - 5.6 % Final    Comment:    (NOTE)         Prediabetes: 5.7 - 6.4         Diabetes: >6.4         Glycemic control for adults with diabetes: <7.0     CBG: No results for input(s): GLUCAP in the last 168 hours.  Review of Systems:   Positives in  BOLD: Gen: Denies fever, chills, weight change, fatigue, night sweats HEENT: Denies blurred vision, double vision, hearing loss, tinnitus, sinus congestion, rhinorrhea, sore throat, neck stiffness, dysphagia PULM: Denies shortness of breath, cough, sputum production, hemoptysis, wheezing CV: Denies chest pain, edema, orthopnea, paroxysmal nocturnal dyspnea, palpitations GI: Denies abdominal pain, nausea, vomiting, diarrhea, hematochezia, melena, constipation, change in bowel habits GU: Denies dysuria, hematuria, polyuria, oliguria, urethral discharge Endocrine: Denies hot or cold intolerance, polyuria, polyphagia or appetite change Derm: Denies rash, dry skin, scaling or peeling skin change Heme: Denies easy bruising, bleeding, bleeding gums Neuro: Denies headache, numbness, weakness, slurred speech, loss of memory or consciousness   Past Medical History:  She,  has a past medical history of COPD (chronic obstructive pulmonary disease) (HCC).   Surgical History:  History reviewed. No pertinent surgical history.   Social History:   reports that she has quit smoking. Her smoking use included cigarettes. She has never used smokeless tobacco. She reports that she does not currently use alcohol. She reports that she does not use drugs.   Family History:  Her family history is not on file.   Allergies No Known Allergies   Home Medications  Prior to Admission medications   Medication Sig Start Date End Date Taking? Authorizing Provider  albuterol  (PROVENTIL ) (2.5 MG/3ML) 0.083% nebulizer solution Take 3 mLs (2.5 mg total) by nebulization every 6 (six) hours as needed for wheezing or shortness of breath. 06/28/21  Yes Wieting, Richard, MD  albuterol  (VENTOLIN  HFA) 108 (90 Base) MCG/ACT inhaler Inhale 2 puffs into the lungs every 6 (six) hours as needed for shortness of breath. 06/28/21  Yes Wieting, Richard, MD  fluticasone-salmeterol (ADVAIR) 250-50 MCG/ACT AEPB Inhale 1 puff into the lungs  2 (two) times daily. 06/08/21  Yes [provider]  Multiple  Vitamin (MULTIVITAMIN WITH MINERALS) TABS tablet Take 1 tablet by mouth daily. 06/29/21  Yes Wieting, Richard, MD  simvastatin  (ZOCOR ) 40 MG tablet Take 40 mg by mouth at bedtime. 05/30/21  Yes [provider]  SPIRIVA HANDIHALER 18 MCG inhalation capsule Place 1 capsule into inhaler and inhale in the morning. 06/08/21  Yes [provider]  Tiotropium Bromide (SPIRIVA HANDIHALER) 18 MCG CAPS Place 18 mcg into inhaler and inhale. 05/28/24 08/26/24 Yes [provider]     Care time: 45 minutes      Inge Lecher, AGACNP-BC [facility] Prefer epic messenger for cross cover needs If after hours, please call E-link

## 2024-06-19 NOTE — Assessment & Plan Note (Addendum)
 Patient placed on BiPAP on presentation.  Spent most of the day and night on BiPAP yesterday.  Hopefully will be able to take off BiPAP today.

## 2024-06-19 NOTE — Progress Notes (Signed)
*  PRELIMINARY RESULTS* Echocardiogram 2D Echocardiogram has been performed.  [name] 06/19/2024, 10:55 AM

## 2024-06-19 NOTE — Assessment & Plan Note (Addendum)
 This is demand ischemia from respiratory distress pneumonia and COPD exacerbation.  Heparin  drip started on admission with elevated troponin, continue aspirin  and Zocor .  LDL 103.  Echocardiogram shows an EF of 50 to 55%.

## 2024-06-19 NOTE — Consult Note (Signed)
 PHARMACY - ANTICOAGULATION CONSULT NOTE  Pharmacy Consult for heparin infusion Indication: chest pain/ACS  No Known Allergies  Patient Measurements: Height: 5' 1 (154.9 cm) Weight: 43.6 kg (96 lb 1.9 oz) IBW/kg (Calculated) : 47.8 HEPARIN DW (KG): 43.6  Vital Signs: Temp: 99.6 F (37.6 C) (11/20 0153) Temp Source: Axillary (11/20 0153) BP: 103/72 (11/20 0401) Pulse Rate: 100 (11/20 0401)  Labs: Recent Labs    06/18/24 2024 06/19/24 0425  HGB 12.5 11.5*  HCT 38.3 34.1*  PLT 247 201  APTT 36  --   LABPROT 13.6  --   INR 1.0  --   HEPARINUNFRC  --  1.01*  CREATININE 0.67  --     Estimated Creatinine Clearance: 49.5 mL/min (by C-G formula based on SCr of 0.67 mg/dL).   Medical History: Past Medical History:  Diagnosis Date   COPD (chronic obstructive pulmonary disease) (HCC)     Medications:  NO AC prior to admission  Assessment: Patient admitted from home with shortness of breath and chest pain. PMH includes COPD, and hypercholesterolemia. No AC prior to admission with troponin 127. Pharmacy consulted to start and manage heparin infusion for ACS.  Baseline hgb,and plt  appropriate to initiate heparin infusion. LFTs within normal limits. Baseline aPTT and INR ordered  Goal of Therapy:  Heparin level 0.3-0.7 units/ml Monitor platelets by anticoagulation protocol: Yes   Plan:  11/20:  HL @ 0425 = 1.01  - pt was given lovenox  40 mg SQ @ 0017 by mistake which would account for elevated HL , will d/c lovenox  - will hold heparin infusion for 1 hr and recheck HL 6 hrs after restart  - Continue to monitor H&H and platelets  Jarome Trull D 06/19/2024 5:07 AM

## 2024-06-20 DIAGNOSIS — I214 Non-ST elevation (NSTEMI) myocardial infarction: Secondary | ICD-10-CM

## 2024-06-20 DIAGNOSIS — J441 Chronic obstructive pulmonary disease with (acute) exacerbation: Secondary | ICD-10-CM | POA: Diagnosis not present

## 2024-06-20 DIAGNOSIS — J181 Lobar pneumonia, unspecified organism: Secondary | ICD-10-CM | POA: Diagnosis not present

## 2024-06-20 DIAGNOSIS — J9601 Acute respiratory failure with hypoxia: Secondary | ICD-10-CM | POA: Diagnosis not present

## 2024-06-20 DIAGNOSIS — R0603 Acute respiratory distress: Secondary | ICD-10-CM

## 2024-06-20 DIAGNOSIS — I2510 Atherosclerotic heart disease of native coronary artery without angina pectoris: Secondary | ICD-10-CM | POA: Diagnosis not present

## 2024-06-20 DIAGNOSIS — J9621 Acute and chronic respiratory failure with hypoxia: Secondary | ICD-10-CM | POA: Diagnosis not present

## 2024-06-20 DIAGNOSIS — A419 Sepsis, unspecified organism: Secondary | ICD-10-CM | POA: Diagnosis not present

## 2024-06-20 DIAGNOSIS — B348 Other viral infections of unspecified site: Secondary | ICD-10-CM | POA: Insufficient documentation

## 2024-06-20 DIAGNOSIS — L8990 Pressure ulcer of unspecified site, unspecified stage: Secondary | ICD-10-CM | POA: Insufficient documentation

## 2024-06-20 DIAGNOSIS — E872 Acidosis, unspecified: Secondary | ICD-10-CM | POA: Diagnosis not present

## 2024-06-20 LAB — RESPIRATORY PANEL BY PCR

## 2024-06-20 LAB — MAGNESIUM: Magnesium: 2.1 mg/dL (ref 1.7–2.4)

## 2024-06-20 LAB — LEGIONELLA PNEUMOPHILA SEROGP 1 UR AG: L. pneumophila Serogp 1 Ur Ag: NEGATIVE

## 2024-06-20 LAB — HIV ANTIBODY (ROUTINE TESTING W REFLEX): HIV Screen 4th Generation wRfx: NONREACTIVE

## 2024-06-20 LAB — CBC
HCT: 34 % — ABNORMAL LOW (ref 36.0–46.0)
Hemoglobin: 11.3 g/dL — ABNORMAL LOW (ref 12.0–15.0)
MCH: 29.4 pg (ref 26.0–34.0)
MCHC: 33.2 g/dL (ref 30.0–36.0)
MCV: 88.5 fL (ref 80.0–100.0)
Platelets: 202 K/uL (ref 150–400)
RBC: 3.84 MIL/uL — ABNORMAL LOW (ref 3.87–5.11)
RDW: 13.7 % (ref 11.5–15.5)
WBC: 14.9 K/uL — ABNORMAL HIGH (ref 4.0–10.5)
nRBC: 0 % (ref 0.0–0.2)

## 2024-06-20 LAB — RENAL FUNCTION PANEL
Albumin: 4 g/dL (ref 3.5–5.0)
Anion gap: 11 (ref 5–15)
BUN: 17 mg/dL (ref 8–23)
CO2: 26 mmol/L (ref 22–32)
Calcium: 9 mg/dL (ref 8.9–10.3)
Chloride: 103 mmol/L (ref 98–111)
Creatinine, Ser: 0.47 mg/dL (ref 0.44–1.00)
GFR, Estimated: >60 mL/min (ref >60)
Glucose, Bld: 135 mg/dL — ABNORMAL HIGH (ref 70–99)
Phosphorus: 2 mg/dL — ABNORMAL LOW (ref 2.5–4.6)
Potassium: 4.2 mmol/L (ref 3.5–5.1)
Sodium: 140 mmol/L (ref 135–145)

## 2024-06-20 LAB — MRSA NEXT GEN BY PCR, NASAL: MRSA by PCR Next Gen: NOT DETECTED

## 2024-06-20 LAB — HEPARIN LEVEL (UNFRACTIONATED): Heparin Unfractionated: 0.18 [IU]/mL — ABNORMAL LOW (ref 0.30–0.70)

## 2024-06-20 MED ORDER — CHLORHEXIDINE GLUCONATE CLOTH 2 % EX PADS
6.0000 | MEDICATED_PAD | Freq: Every day | CUTANEOUS | Status: DC
  Administered 2024-06-21 – 2024-06-23 (×2): 6 via TOPICAL

## 2024-06-20 MED ORDER — HEPARIN BOLUS VIA INFUSION
1300.0000 [IU] | Freq: Once | INTRAVENOUS | Status: AC
  Administered 2024-06-20: 1300 [IU] via INTRAVENOUS
  Filled 2024-06-20: qty 1300

## 2024-06-20 MED ORDER — SODIUM PHOSPHATES 45 MMOLE/15ML IV SOLN
15.0000 mmol | Freq: Once | INTRAVENOUS | Status: AC
  Administered 2024-06-20: 15 mmol via INTRAVENOUS
  Filled 2024-06-20: qty 5

## 2024-06-20 MED ORDER — IPRATROPIUM-ALBUTEROL 0.5-2.5 (3) MG/3ML IN SOLN
3.0000 mL | RESPIRATORY_TRACT | Status: DC | PRN
Start: 2024-06-20 — End: 2024-06-26
  Administered 2024-06-21: 3 mL via RESPIRATORY_TRACT
  Filled 2024-06-20: qty 3

## 2024-06-20 MED ORDER — MORPHINE SULFATE (PF) 2 MG/ML IV SOLN
2.0000 mg | Freq: Once | INTRAVENOUS | Status: AC
  Administered 2024-06-20: 2 mg via INTRAVENOUS
  Filled 2024-06-20: qty 1

## 2024-06-20 MED ORDER — BOOST PLUS PO LIQD
237.0000 mL | Freq: Three times a day (TID) | ORAL | Status: DC
  Administered 2024-06-22 – 2024-06-26 (×9): 237 mL via ORAL

## 2024-06-20 MED ORDER — ADULT MULTIVITAMIN W/MINERALS CH
1.0000 | ORAL_TABLET | Freq: Every day | ORAL | Status: DC
  Administered 2024-06-21: 1
  Filled 2024-06-20: qty 1

## 2024-06-20 MED ORDER — ENOXAPARIN SODIUM 40 MG/0.4ML IJ SOSY
40.0000 mg | PREFILLED_SYRINGE | INTRAMUSCULAR | Status: DC
  Administered 2024-06-20 – 2024-06-26 (×7): 40 mg via SUBCUTANEOUS
  Filled 2024-06-20 (×7): qty 0.4

## 2024-06-20 MED ORDER — BUDESONIDE 0.5 MG/2ML IN SUSP
0.5000 mg | Freq: Two times a day (BID) | RESPIRATORY_TRACT | Status: DC
  Administered 2024-06-20 – 2024-06-26 (×13): 0.5 mg via RESPIRATORY_TRACT
  Filled 2024-06-20 (×13): qty 2

## 2024-06-20 MED ORDER — THIAMINE HCL 100 MG PO TABS
100.0000 mg | ORAL_TABLET | Freq: Every day | ORAL | Status: DC
Start: 2024-06-21 — End: 2024-06-28
  Administered 2024-06-21 – 2024-06-26 (×6): 100 mg via ORAL
  Filled 2024-06-20 (×12): qty 1

## 2024-06-20 MED ORDER — IPRATROPIUM-ALBUTEROL 0.5-2.5 (3) MG/3ML IN SOLN
3.0000 mL | Freq: Three times a day (TID) | RESPIRATORY_TRACT | Status: DC
  Administered 2024-06-20 – 2024-06-23 (×8): 3 mL via RESPIRATORY_TRACT
  Filled 2024-06-20 (×8): qty 3

## 2024-06-20 MED ORDER — MORPHINE SULFATE (PF) 2 MG/ML IV SOLN
2.0000 mg | INTRAVENOUS | Status: DC | PRN
  Administered 2024-06-20 – 2024-06-23 (×8): 2 mg via INTRAVENOUS
  Filled 2024-06-20 (×8): qty 1

## 2024-06-20 NOTE — Progress Notes (Addendum)
 NAME:  Gina Bates, MRN:  [id number], DOB:  11/06/1960, LOS: 2 ADMISSION DATE:  06/18/2024, CONSULTATION DATE:  06/19/2024 REFERRING MD:  Dr. Josette, CHIEF COMPLAINT:  Acute Hypoxic Respiratory Failure    Brief Pt Description / Synopsis:  63 y.o. female with PMHx significant for COPD admitted with Acute on Chronic Hypoxic Respiratory Failure in the setting of AECOPD, Strep Pneumoniae Pneumonia, and Rhinovirus infection requiring BiPAP.   History of Present Illness:  Gina Bates is a 63 y.o. female with a past medical history significant for COPD who presented to [facility] ED on 06/18/2024 due to complaints of sudden onset of shortness of breath and hypoxia.    On EMS arrival she was found to be hypoxia with O2 sts of 80% on room air, of which she was given albuterol  and Solumedrol, and placed on CPAP.  Upon arrival to the ED she was still exhibiting significant shortness of breath and in the tripod position, of which she was placed on BiPAP.  She denied peripheral edema.   ED Course: Initial Vital Signs: Temperature 98.3, blood pressure 120/73, pulse 126 respirate 29 oxygen sat 99% on BiPAP FiO2 50%  Significant Labs: WBC 21.3, glucose 335, Pro BNP 659, HS Troponin 127, lactic acid 2.5 VBG: pH 7.27/pO2 171/pCO2 51/ Bicarb 23.4 COVID/FLU/RSV PCR negative  Imaging Chest X-ray>>IMPRESSION: 1. Hyperinflation of lungs and emphysema. 2. Superimposed patchy infiltrate of the left lung base, consistent with acute infection or aspiration. 3. Small bilateral pleural effusions. Medications Administered:   TRH asked to admit for further workup and treatment.  Please see Significant Hospital Events section below for full detailed hospital course.   Pertinent  Medical History   Past Medical History:  Diagnosis Date   COPD (chronic obstructive pulmonary disease) (HCC)     Micro Data:  11/19: Blood cultures x2>> 1/4 bottles with Staphylococcus species (? Contaminant ) 11/19: COVID/FLU/RSV  PCR>> negative 11/20: RVP>> + Rhinovirus 11/20: Sputum>> 11/20: Strep pneumo urinary antigen + 11/20: Legionella urinary antigen>>  Antimicrobials:   Anti-infectives (From admission, onward)    Start     Dose/Rate Route Frequency Ordered Stop   06/19/24 2200  cefTRIAXone  (ROCEPHIN ) 2 g in sodium chloride  0.9 % 100 mL IVPB        2 g 200 mL/hr over 30 Minutes Intravenous Every 24 hours 06/18/24 2241 06/24/24 2159   06/18/24 2245  azithromycin  (ZITHROMAX ) 500 mg in sodium chloride  0.9 % 250 mL IVPB        500 mg 250 mL/hr over 60 Minutes Intravenous Every 24 hours 06/18/24 2241 06/23/24 2244   06/18/24 2130  cefTRIAXone  (ROCEPHIN ) 2 g in sodium chloride  0.9 % 100 mL IVPB        2 g 200 mL/hr over 30 Minutes Intravenous Once 06/18/24 2115 06/18/24 2252   06/18/24 2130  azithromycin  (ZITHROMAX ) 500 mg in sodium chloride  0.9 % 250 mL IVPB  Status:  Discontinued        500 mg 250 mL/hr over 60 Minutes Intravenous  Once 06/18/24 2115 06/18/24 2245       Significant Hospital Events: Including procedures, antibiotic start and stop dates in addition to other pertinent events   11/19: Admitted by TRH for Acute on Chronic Hypoxic Respiratory Failure due to AECOPD, pneumonia, and questionable CHF requiring BiPAP. 11/20: Remains on BiPAP, PCCM and Cardiology consulted. Refused CTa Chest to rule out PE. 11/21: No significant events overnight. Afebrile, hemodynamically stable, tolerated BiPAP overnight.  Will attempt to wean off BiPAP as tolerated.  Strep pneumo urinary antigen came back positive, along with rhinovirus. Heparin  gtt d/c by Cardiology and Cardiology signing off. Pt very anxious and apprehensive about removing BiPAP, will give morphine  2 mg x1 dose and trial HHFNC.  Interim History / Subjective:  As outlined above under Significant Hospital Events section  Objective   Blood pressure 130/80, pulse (!) 111, temperature 98.5 F (36.9 C), temperature source Axillary, resp. rate (!)  30, height 5' (1.524 m), weight 47.1 kg, SpO2 98%.    FiO2 (%):  [30 %-40 %] 40 % PEEP:  [5 cmH20] 5 cmH20 Pressure Support:  [5 cmH20] 5 cmH20   Intake/Output Summary (Last 24 hours) at 06/20/2024 0738 Last data filed at 06/20/2024 0700 Gross per 24 hour  Intake 864.74 ml  Output 600 ml  Net 264.74 ml   Filed Weights   06/18/24 2012 06/19/24 2206  Weight: 43.6 kg 47.1 kg    Examination: General: Acutely ill appearing female. Laying in bed, on BiPAP, anxious with mild respiratory distress HENT: Atraumatic, normocephalic, neck supple, no JVD Lungs: Diminished breath sounds throughout with faint expiratory wheezing to bilateral bases , BiPAP assisted, tachypnea, mild assessory muscle use  Cardiovascular: RRR, s1s2, no M/R/G Abdomen: Soft, nontender, nondistended, no guarding or rebound tenderness, BS+ x4 Extremities: Normal bulk and tone, no deformities, no edema Neuro: Awake and alert, oriented x4, anxious, moves all extremities purposefully, no focal deficits noted GU: deferred, voiding   Resolved Hospital Problem list     Assessment & Plan:   #Acute on Chronic Hypoxic Respiratory Failure #Acute COPD Exacerbation #Community Acquired Pneumonia  PMHx: COPD, prescribed for 2L supplemental Oxygen -Supplemental O2 as needed to maintain O2 sats 88 to 92% -BiPAP, wean as tolerated -Follow intermittent Chest X-ray & ABG as needed -Bronchodilators & Pulmicort  nebs -IV Steroids (Solumedrol 40 mg daily) -ABX as above -Pulmonary toilet as able -Pt refused CTa Chest to rule out PE  #Elevated Troponin due to demand ischemia -Echocardiogram 06/19/24: LVEF 50-55%, indeterminate diastolic parameters, RV systolic function and RV size both normal -Continuous cardiac monitoring -Maintain MAP >65 -Vasopressors as needed to maintain MAP goal ~ not requiring  -Lactic acid has normalized -HS Troponin peaked at 278 -Cardiology following, appreciate input,  ~ Heparin  gtt d/c 11/21 with  Cardiology sign off   #Met SIRS Criteria on presentation (Pulse 125, RR 24, WBC 21.3) #Sepsis due to ... #Community Acquired Pneumonia: Strep Pneumoniae Pneumonia #Rhinovirus infection  -Monitor fever curve -Trend WBC's & Procalcitonin -Follow cultures as above -Continue empiric Azithromycin  & Ceftriaxone  pending cultures & sensitivities  #Anxiety -Treatment of metabolic derangements as outlined above -Provide supportive care -Promote normal sleep/wake cycle and family presence -Avoid sedating medications as able -Prn Xanax  -Will give morphine  2 mg x1 dose for air hunger      Best Practice (right click and Reselect all SmartList Selections daily)   Diet/type: NPO until maintaining off BiPAP  DVT prophylaxis: Lovenox   GI prophylaxis: N/A Lines: N/A Foley:  N/A Code Status:  full code Last date of multidisciplinary goals of care discussion [11/21]  11/21: Pt and her granddaugher updated at bedside on plan of care.  Labs   CBC: Recent Labs  Lab 06/18/24 2024 06/19/24 0425 06/20/24 0422  WBC 21.3* 17.3* 14.9*  NEUTROABS 14.9*  --   --   HGB 12.5 11.5* 11.3*  HCT 38.3 34.1* 34.0*  MCV 89.9 87.2 88.5  PLT 247 201 202    Basic Metabolic Panel: Recent Labs  Lab 06/18/24 2024 06/19/24 0425 06/20/24  0422  NA 136 138 140  K 4.6 4.4 4.2  CL 99 101 103  CO2 21* 23 26  GLUCOSE 335* 153* 135*  BUN 9 12 17   CREATININE 0.67 0.60 0.47  CALCIUM 9.2 9.1 9.0  MG  --   --  2.1  PHOS  --   --  2.0*   GFR: Estimated Creatinine Clearance: 51.7 mL/min (by C-G formula based on SCr of 0.47 mg/dL). Recent Labs  Lab 06/18/24 2024 06/18/24 2256 06/19/24 0425 06/19/24 1146 06/19/24 1407 06/20/24 0422  WBC 21.3*  --  17.3*  --   --  14.9*  LATICACIDVEN  --  2.5*  --  1.6 1.0  --     Liver Function Tests: Recent Labs  Lab 06/18/24 2024 06/19/24 0425 06/20/24 0422  AST 38 42*  --   ALT 37 36  --   ALKPHOS 89 78  --   BILITOT 0.5 0.4  --   PROT 7.6 7.3  --    ALBUMIN 4.2 4.0 4.0   No results for input(s): LIPASE, AMYLASE in the last 168 hours. No results for input(s): AMMONIA in the last 168 hours.  ABG    Component Value Date/Time   HCO3 23.4 06/18/2024 2024   ACIDBASEDEF 4.0 (H) 06/18/2024 2024   O2SAT 100 06/18/2024 2024     Coagulation Profile: Recent Labs  Lab 06/18/24 2024  INR 1.0    Cardiac Enzymes: No results for input(s): CKTOTAL, CKMB, CKMBINDEX, TROPONINI in the last 168 hours.  HbA1C: Hgb A1c MFr Bld  Date/Time Value Ref Range Status  06/19/2024 04:25 AM 5.6 4.8 - 5.6 % Final    Comment:    (NOTE) Diagnosis of Diabetes The following HbA1c ranges recommended by the American Diabetes Association (ADA) may be used as an aid in the diagnosis of diabetes mellitus.  Hemoglobin             Suggested A1C NGSP%              Diagnosis  <5.7                   Non Diabetic  5.7-6.4                Pre-Diabetic  >6.4                   Diabetic  <7.0                   Glycemic control for                       adults with diabetes.    06/24/2021 06:13 AM 5.8 (H) 4.8 - 5.6 % Final    Comment:    (NOTE)         Prediabetes: 5.7 - 6.4         Diabetes: >6.4         Glycemic control for adults with diabetes: <7.0     CBG: Recent Labs  Lab 06/19/24 2206  GLUCAP 152*    Review of Systems:   Positives in BOLD: Gen: Denies fever, chills, weight change, fatigue, night sweats, anxious  HEENT: Denies blurred vision, double vision, hearing loss, tinnitus, sinus congestion, rhinorrhea, sore throat, neck stiffness, dysphagia PULM: Denies shortness of breath, cough, sputum production, hemoptysis, wheezing CV: Denies chest pain, edema, orthopnea, paroxysmal nocturnal dyspnea, palpitations GI: Denies abdominal pain, nausea, vomiting, diarrhea, hematochezia, melena, constipation, change in bowel  habits GU: Denies dysuria, hematuria, polyuria, oliguria, urethral discharge Endocrine: Denies hot or cold  intolerance, polyuria, polyphagia or appetite change Derm: Denies rash, dry skin, scaling or peeling skin change Heme: Denies easy bruising, bleeding, bleeding gums Neuro: Denies headache, numbness, weakness, slurred speech, loss of memory or consciousness   Past Medical History:  She,  has a past medical history of COPD (chronic obstructive pulmonary disease) (HCC).   Surgical History:  History reviewed. No pertinent surgical history.   Social History:   reports that she has quit smoking. Her smoking use included cigarettes. She has never used smokeless tobacco. She reports that she does not currently use alcohol. She reports that she does not use drugs.   Family History:  Her family history is not on file.   Allergies No Known Allergies   Home Medications  Prior to Admission medications   Medication Sig Start Date End Date Taking? Authorizing Provider  albuterol  (PROVENTIL ) (2.5 MG/3ML) 0.083% nebulizer solution Take 3 mLs (2.5 mg total) by nebulization every 6 (six) hours as needed for wheezing or shortness of breath. 06/28/21  Yes Wieting, Richard, MD  albuterol  (VENTOLIN  HFA) 108 [phone] Base) MCG/ACT inhaler Inhale 2 puffs into the lungs every 6 (six) hours as needed for shortness of breath. 06/28/21  Yes Wieting, Richard, MD  fluticasone-salmeterol (ADVAIR) 250-50 MCG/ACT AEPB Inhale 1 puff into the lungs 2 (two) times daily. 06/08/21  Yes [provider]  Multiple Vitamin (MULTIVITAMIN WITH MINERALS) TABS tablet Take 1 tablet by mouth daily. 06/29/21  Yes Wieting, Richard, MD  simvastatin  (ZOCOR ) 40 MG tablet Take 40 mg by mouth at bedtime. 05/30/21  Yes [provider]  SPIRIVA HANDIHALER 18 MCG inhalation capsule Place 1 capsule into inhaler and inhale in the morning. 06/08/21  Yes [provider]  Tiotropium Bromide (SPIRIVA HANDIHALER) 18 MCG CAPS Place 18 mcg into inhaler and inhale. 05/28/24 08/26/24 Yes [provider]     Care time: 40  minutes      Inge Lecher, AGACNP-BC [facility] Prefer epic messenger for cross cover needs If after hours, please call E-link

## 2024-06-20 NOTE — Plan of Care (Signed)
  Problem: Education: Goal: Knowledge of General Education information will improve Description: Including pain rating scale, medication(s)/side effects and non-pharmacologic comfort measures 06/20/2024 0424 by Talbert Harlene BIRCH, RN Outcome: Progressing 06/20/2024 0416 by Talbert Harlene BIRCH, RN Outcome: Progressing   Problem: Clinical Measurements: Goal: Diagnostic test results will improve Outcome: Progressing Goal: Respiratory complications will improve Outcome: Progressing Goal: Cardiovascular complication will be avoided 06/20/2024 0424 by Talbert Harlene BIRCH, RN Outcome: Progressing 06/20/2024 0416 by Talbert Harlene BIRCH, RN Outcome: Progressing   Problem: Clinical Measurements: Goal: Cardiovascular complication will be avoided 06/20/2024 0424 by Talbert Harlene BIRCH, RN Outcome: Progressing 06/20/2024 0416 by Talbert Harlene BIRCH, RN Outcome: Progressing   Problem: Coping: Goal: Level of anxiety will decrease Outcome: Progressing   Problem: Pain Managment: Goal: General experience of comfort will improve and/or be controlled Outcome: Progressing

## 2024-06-20 NOTE — Progress Notes (Signed)
 Initial Nutrition Assessment  DOCUMENTATION CODES:   Non-severe (moderate) malnutrition in context of chronic illness  INTERVENTION:   Boost Plus chocolate po TID- Each supplement provides 360kcal and 14g protein.    Magic cup TID with meals, each supplement provides 290 kcal and 9 grams of protein  MVI po daily   Thiamine  100mg  po daily x 7 days   Liberalize diet   Pt at high refeed risk; recommend monitor potassium, magnesium and phosphorus labs daily until stable  Daily weights   NUTRITION DIAGNOSIS:   Moderate Malnutrition related to catabolic illness (COPD) as evidenced by moderate fat depletion, moderate muscle depletion, severe muscle depletion.  GOAL:   Patient will meet greater than or equal to 90% of their needs  MONITOR:   PO intake, Supplement acceptance, Labs, Weight trends, I & O's, Skin  REASON FOR ASSESSMENT:   Consult Assessment of nutrition requirement/status  ASSESSMENT:   63 y/o female with h/o anxiety, CAD, HLD and COPD who is admitted with rhinovirus, PNA, sepsis, SIRS, demand ischemia and COPD exacerbation.  Met with pt in room today. Pt reports that she is feeling better. Pt reports decreased appetite and oral intake for several months pta. Pt reports that she has lost ~10lbs over the past 2 months; there is no recent weight history in chart. Pt has been drinking chocolate Boost at home. RD discussed with pt the importance of adequate nutrition needed to preserve lean muscle. Pt is agreeable to supplements in hospital. Pt is at high refeed risk. Pt is at high risk for developing severe malnutrition.   Medications reviewed and include: aspirin , lovenox , solu-medrol , azithromycin , ceftriaxone , sodium phosphate    Labs reviewed: K 4.2 wnl, P 2.0(L), Mg 2.1 wnl  Wbc- 14.9(H)  UOP-   NUTRITION - FOCUSED PHYSICAL EXAM:  Flowsheet Row Most Recent Value  Orbital Region Moderate depletion  Upper Arm Region Moderate depletion  Thoracic and  Lumbar Region Severe depletion  Buccal Region Mild depletion  Temple Region Moderate depletion  Clavicle Bone Region Severe depletion  Clavicle and Acromion Bone Region Severe depletion  Scapular Bone Region Severe depletion  Dorsal Hand Moderate depletion  Patellar Region Severe depletion  Anterior Thigh Region Severe depletion  Posterior Calf Region Severe depletion  Edema (RD Assessment) None  Hair Reviewed  Eyes Reviewed  Mouth Reviewed  Skin Reviewed  Nails Reviewed   Diet Order:   Diet Order             Diet Heart Room service appropriate? Yes; Fluid consistency: Thin  Diet effective now                  EDUCATION NEEDS:   Education needs have been addressed  Skin:  Skin Assessment: Reviewed RN Assessment (pressure injury nose)  Last BM:  11/18  Height:   Ht Readings from Last 1 Encounters:  06/19/24 5' (1.524 m)    Weight:   Wt Readings from Last 1 Encounters:  06/19/24 47.1 kg    Ideal Body Weight:  45.45 kg  BMI:  Body mass index is 20.28 kg/m.  Estimated Nutritional Needs:   Kcal:  1400-1600kcal/day  Protein:  70-80g/day  Fluid:  1.4-1.6L/day  Augustin Shams MS, RD, LDN If unable to be reached, please send secure chat to RD inpatient available from 8:00a-4:00p daily

## 2024-06-20 NOTE — Consult Note (Signed)
 PHARMACY - ANTICOAGULATION CONSULT NOTE  Pharmacy Consult for heparin  infusion Indication: chest pain/ACS  No Known Allergies  Patient Measurements: Height: 5' (152.4 cm) Weight: 47.1 kg (103 lb 13.4 oz) IBW/kg (Calculated) : 45.5 HEPARIN  DW (KG): 47.1  Vital Signs: Temp: 98.5 F (36.9 C) (11/21 0400) Temp Source: Axillary (11/21 0400) BP: 114/74 (11/21 0400) Pulse Rate: 109 (11/21 0400)  Labs: Recent Labs    06/18/24 2024 06/18/24 2024 06/19/24 0425 06/19/24 1146 06/19/24 1754 06/20/24 0422  HGB 12.5  --  11.5*  --   --  11.3*  HCT 38.3  --  34.1*  --   --  34.0*  PLT 247  --  201  --   --  202  APTT 36  --   --   --   --   --   LABPROT 13.6  --   --   --   --   --   INR 1.0  --   --   --   --   --   HEPARINUNFRC  --    < > 1.01* 0.41 0.25* 0.18*  CREATININE 0.67  --  0.60  --   --  0.47   < > = values in this interval not displayed.    Estimated Creatinine Clearance: 51.7 mL/min (by C-G formula based on SCr of 0.47 mg/dL).   Medical History: Past Medical History:  Diagnosis Date   COPD (chronic obstructive pulmonary disease) (HCC)     Medications:  NO AC prior to admission  Assessment: Patient admitted from home with shortness of breath and chest pain. PMH includes COPD, and hypercholesterolemia. No AC prior to admission with troponin 127. Pharmacy consulted to start and manage heparin  infusion for ACS.  Baseline hgb,and plt  appropriate to initiate heparin  infusion. LFTs within normal limits. Baseline aPTT and INR ordered  11/20@1146 : HL 0.41, therapeutic x 1 11/20@1754 : HL 0.25, subtherapeutic 11/21@0422 : HL 0.18, SUBtherapeutic   Goal of Therapy:  Heparin  level 0.3-0.7 units/ml Monitor platelets by anticoagulation protocol: Yes   Plan:  11/21:  HL @ 0422 = 0.18, SUBtherapeutic - will order heparin  1300 units IV X 1 bolus and increase drip rate to 800 units/hr - recheck HL 6 hrs after rate change - Continue to monitor H&H and  platelets  Anyelo Mccue D, PharmD 06/20/2024 5:10 AM

## 2024-06-20 NOTE — Progress Notes (Signed)
 0845 valium  given so patient will then take off BIPAP and try [place]  0900 patient tripod breathing in the 30s with BIPAP on refusing to try [place] stating she's scared  0913 patient looks more comfortable but still refusing to come off BIPAP at this time states I need to rest

## 2024-06-20 NOTE — Plan of Care (Signed)
  Problem: Education: Goal: Knowledge of General Education information will improve Description: Including pain rating scale, medication(s)/side effects and non-pharmacologic comfort measures Outcome: Progressing   Problem: Nutrition: Goal: Adequate nutrition will be maintained Outcome: Progressing   Problem: Coping: Goal: Level of anxiety will decrease Outcome: Progressing   Problem: Elimination: Goal: Will not experience complications related to urinary retention Outcome: Progressing   Problem: Pain Managment: Goal: General experience of comfort will improve and/or be controlled Outcome: Progressing

## 2024-06-20 NOTE — Progress Notes (Signed)
 Progress Note   Patient: Gina Bates FMW:[id number] DOB: July 14, 1961 DOA: 06/18/2024     2 DOS: the patient was seen and examined on 06/20/2024   Brief hospital course:  63 y.o. female with medical history significant of COPD, elevated troponin history, who presents to the ER with sudden onset of shortness of breath and hypoxia.  Patient brought in by EMS on CPAP.  Oxygen sat was 80% on room air apparently when they arrived.  Patient has received albuterol  and Solu-Medrol .  Still having significant shortness of breath.  Patient is in the tripod position right now.  Workup in the ER showed suspected pneumonia but also fluid overload.  Suspected new CHF.  Patient has been placed on BiPAP.  Additionally troponin was noted to be more than 100.  No chest pain.  No peripheral edema.  Patient has been admitted with acute on chronic hypoxic respiratory failure.   11/20.  Patient admitted with respiratory distress and placed on BiPAP.  We tried to take off BiPAP this morning but needed to go back on BiPAP secondary to shortness of breath.  Continue antibiotics for pneumonia and Solu-Medrol .  Patient initially placed on heparin  drip secondary to elevated troponin.  No complaints of chest pain. 11/21.  Patient required BiPAP and an extra dose of Solu-Medrol  last night.  Respiratory panel positive for rhinovirus.  Streptococcus pneumoniae urinary antigen positive.  Patient still feels short of breath.  Assessment and Plan: * Acute respiratory distress Patient placed on BiPAP on presentation.  Spent most of the day and night on BiPAP yesterday.  Hopefully will be able to take off BiPAP today.  Acute on chronic respiratory failure with hypoxia [facility]) Patient requiring BiPAP on presentation and most of the day yesterday..  Normally wears 2 L of oxygen.  Severe sepsis (HCC) Present on admission with tachycardia, tachypnea, leukocytosis and lobar pneumonia and lactic acidosis.  Continue Rocephin  and Zithromax .   Respiratory viral panel positive for rhinovirus.  Urinary Streptococcus pneumoniae antigen positive.  Lobar pneumonia Continue Rocephin  and Zithromax .  1 bottle positive for staph hominis which is likely contamination.  Urinary Streptococcus pneumoniae antigen positive.  COPD exacerbation (HCC) Continue IV Solu-Medrol  and nebulizer treatments.  Given an extra dose of Solu-Medrol  last night.  Pressure injury of skin From continuous BiPAP. Wound 06/20/24 1217 Pressure Injury Nose Mid Stage 2 -  Partial thickness loss of dermis presenting as a shallow open injury with a red, pink wound bed without slough. (Active)      Myocardial injury I suspect demand ischemia from respiratory distress pneumonia and COPD exacerbation.  Heparin  drip started on admission with elevated troponin, continue aspirin  and Zocor .  Add on an LDL.  Echocardiogram shows an EF of 50 to 55%.  Lactic acidosis Improved     Subjective: Patient still feeling short of breath.  Still with some cough and some wheeze.  Admitted with acute respiratory distress with COPD exacerbation and pneumonia  Physical Exam: Vitals:   06/20/24 0900 06/20/24 1157 06/20/24 1200 06/20/24 1213  BP: 109/69  (!) 151/99   Pulse: (!) 120  (!) 129 (!) 139  Resp: (!) 24  (!) 28 (!) 23  Temp:    (!) 97.2 F (36.2 C)  TempSrc:    Axillary  SpO2: 95% 95% 92% 97%  Weight:      Height:       Physical Exam HENT:     Head: Normocephalic.     Mouth/Throat:     Pharynx: No oropharyngeal exudate.  Eyes:     General: Lids are normal.     Conjunctiva/sclera: Conjunctivae normal.  Cardiovascular:     Rate and Rhythm: Normal rate and regular rhythm.     Heart sounds: Normal heart sounds, S1 normal and S2 normal.  Pulmonary:     Effort: Accessory muscle usage present.     Breath sounds: Examination of the right-lower field reveals decreased breath sounds and rhonchi. Examination of the left-lower field reveals decreased breath sounds and  rhonchi. Decreased breath sounds and rhonchi present. No wheezing or rales.  Abdominal:     Palpations: Abdomen is soft.     Tenderness: There is no abdominal tenderness.  Musculoskeletal:     Right lower leg: No swelling.     Left lower leg: No swelling.  Skin:    General: Skin is warm.     Findings: No rash.  Neurological:     Mental Status: She is alert and oriented to person, place, and time.     Data Reviewed: Creatinine 0.47, electrolytes normal range, white blood cell count 14.9, hemoglobin 11.3, platelet count 202  Family Communication: Granddaughter at bedside  Disposition: Status is: Inpatient Remains inpatient appropriate because: Patient nervous about coming off BiPAP.  Hopefully will have more time off BiPAP today.  Planned Discharge Destination: Home with Home Health    Time spent: 28 minutes  Author: Charlie Patterson, MD 06/20/2024 12:34 PM  For on call review [email or web address].

## 2024-06-20 NOTE — Assessment & Plan Note (Signed)
 From continuous BiPAP. Wound 06/20/24 1217 Pressure Injury Nose Mid Stage 2 -  Partial thickness loss of dermis presenting as a shallow open injury with a red, pink wound bed without slough. (Active)

## 2024-06-20 NOTE — Progress Notes (Signed)
 PHARMACY CONSULT NOTE - FOLLOW UP  Pharmacy Consult for Electrolyte Monitoring and Replacement   Recent Labs: Potassium (mmol/L)  Date Value  06/20/2024 4.2   Magnesium (mg/dL)  Date Value  88/78/7974 2.1   Calcium (mg/dL)  Date Value  88/78/7974 9.0   Albumin (g/dL)  Date Value  88/78/7974 4.0   Phosphorus (mg/dL)  Date Value  88/78/7974 2.0 (L)   Sodium (mmol/L)  Date Value  06/20/2024 140    Assessment: 63 y.o. female with medical history significant of COPD, elevated troponin history, who presents to the ER with sudden onset of shortness of breath and hypoxia. Pharmacy is asked to follow and replace electrolytes while in CCU  Goal of Therapy:  Electrolytes WNL  Plan:  ---15 mmol IV sodium phosphate  x 1 ---recheck electrolytes in am  Gina Bates ,PharmD Clinical Pharmacist 06/20/2024 9:27 AM

## 2024-06-20 NOTE — Plan of Care (Deleted)
  Problem: Education: Goal: Knowledge of General Education information will improve Description: Including pain rating scale, medication(s)/side effects and non-pharmacologic comfort measures Outcome: Progressing   Problem: Clinical Measurements: Goal: Diagnostic test results will improve Outcome: Progressing Goal: Cardiovascular complication will be avoided Outcome: Progressing   Problem: Clinical Measurements: Goal: Cardiovascular complication will be avoided Outcome: Progressing   Problem: Pain Managment: Goal: General experience of comfort will improve and/or be controlled Outcome: Progressing

## 2024-06-20 NOTE — Consult Note (Addendum)
 Cardiology Consultation   Patient ID: Gina Bates MRN: [id number]; DOB: 29-Oct-1960  Admit date: 06/18/2024 Date of Consult: 06/20/2024  PCP:  Kerry Rollene SAUNDERS, MD   [facility] Providers Cardiologist: new to [facility] Physician requesting consult: Dr.Wieting Reason for consult: Elevated troponin  Patient Profile: Gina Bates is a 63 y.o. female with a hx of COPD, smoker, hyperlipidemia presenting to the emergency room June 18, 2024 with worsening shortness of breath, hypoxia Cardiology consulted for elevated troponin  Interval history:  No events overnight.  She continues to deny chest pain.  Sinus tachycardia on telemetry, but no significant arrhythmias seen.  Past Medical History:  Diagnosis Date   COPD (chronic obstructive pulmonary disease) (HCC)     History reviewed. No pertinent surgical history.   Home Medications:  Prior to Admission medications   Medication Sig Start Date End Date Taking? Authorizing Provider  albuterol  (PROVENTIL ) (2.5 MG/3ML) 0.083% nebulizer solution Take 3 mLs (2.5 mg total) by nebulization every 6 (six) hours as needed for wheezing or shortness of breath. 06/28/21  Yes Wieting, Richard, MD  albuterol  (VENTOLIN  HFA) 108 (90 Base) MCG/ACT inhaler Inhale 2 puffs into the lungs every 6 (six) hours as needed for shortness of breath. 06/28/21  Yes Wieting, Richard, MD  fluticasone-salmeterol (ADVAIR) 250-50 MCG/ACT AEPB Inhale 1 puff into the lungs 2 (two) times daily. 06/08/21  Yes [provider]  Multiple Vitamin (MULTIVITAMIN WITH MINERALS) TABS tablet Take 1 tablet by mouth daily. 06/29/21  Yes Wieting, Richard, MD  simvastatin  (ZOCOR ) 40 MG tablet Take 40 mg by mouth at bedtime. 05/30/21  Yes [provider]  SPIRIVA HANDIHALER 18 MCG inhalation capsule Place 1 capsule into inhaler and inhale in the morning. 06/08/21  Yes [provider]  Tiotropium Bromide (SPIRIVA HANDIHALER) 18 MCG CAPS Place 18 mcg  into inhaler and inhale. 05/28/24 08/26/24 Yes [provider]    Scheduled Meds:  aspirin  EC  81 mg Oral Daily   Chlorhexidine  Gluconate Cloth  6 each Topical Daily   ipratropium-albuterol   3 mL Nebulization Q4H   methylPREDNISolone  (SOLU-MEDROL ) injection  40 mg Intravenous Daily   simvastatin   40 mg Oral QHS   Continuous Infusions:  azithromycin  Stopped (06/20/24 0000)   cefTRIAXone  (ROCEPHIN )  IV Stopped (06/19/24 2250)   heparin  800 Units/hr (06/20/24 0800)   PRN Meds: albuterol , ALPRAZolam   Allergies:   No Known Allergies  Social History:   Social History   Socioeconomic History   Marital status: Married    Spouse name: Not on file   Number of children: Not on file   Years of education: Not on file   Highest education level: Not on file  Occupational History   Not on file  Tobacco Use   Smoking status: Former    Types: Cigarettes   Smokeless tobacco: Never  Substance and Sexual Activity   Alcohol use: Not Currently   Drug use: Never   Sexual activity: Not on file  Other Topics Concern   Not on file  Social History Narrative   Not on file   Social Drivers of Health   Financial Resource Strain: Low Risk (05/28/2024)   Received from [facility]   Overall Financial Resource Strain (CARDIA)    How hard is it for you to pay for the very basics like food, housing, medical care, and heating?: Not hard at all  Food Insecurity: No Food Insecurity (06/20/2024)   Hunger Vital Sign    Worried About Running Out of Food in  the Last Year: Never true    Ran Out of Food in the Last Year: Never true  Transportation Needs: No Transportation Needs (06/20/2024)   PRAPARE - Administrator, Civil Service (Medical): No    Lack of Transportation (Non-Medical): No  Physical Activity: Not on file  Stress: Not on file  Social Connections: Not on file  Intimate Partner Violence: Not At Risk (06/20/2024)   Humiliation, Afraid, Rape, and Kick questionnaire     Fear of Current or Ex-Partner: No    Emotionally Abused: No    Physically Abused: No    Sexually Abused: No    Family History:   History reviewed. No pertinent family history.   ROS:  Please see the history of present illness.  Review of Systems  Constitutional: Negative.   HENT: Negative.    Respiratory:  Positive for shortness of breath.   Cardiovascular: Negative.   Gastrointestinal: Negative.   Musculoskeletal: Negative.   Neurological: Negative.   Psychiatric/Behavioral: Negative.    All other systems reviewed and are negative.   Physical Exam/Data: Vitals:   06/20/24 0700 06/20/24 0746 06/20/24 0800 06/20/24 0816  BP: 118/76  118/74   Pulse: (!) 110  (!) 107 (!) 107  Resp: (!) 21  18 18   Temp:  97.6 F (36.4 C)  97.7 F (36.5 C)  TempSrc:  Oral  Oral  SpO2: 98%  99% 98%  Weight:      Height:        Intake/Output Summary (Last 24 hours) at 06/20/2024 0846 Last data filed at 06/20/2024 0800 Gross per 24 hour  Intake 872.75 ml  Output 600 ml  Net 272.75 ml      06/19/2024   10:06 PM 06/18/2024    8:12 PM 06/24/2021    3:02 AM  Last 3 Weights  Weight (lbs) 103 lb 13.4 oz 96 lb 1.9 oz 107 lb  Weight (kg) 47.1 kg 43.6 kg 48.535 kg     Body mass index is 20.28 kg/m.  General:  Well nourished, well developed, in no acute distress HEENT: normal Neck: no JVD Vascular: No carotid bruits; Distal pulses 2+ bilaterally Cardiac:  normal S1, S2; RRR; no murmur Lungs: Decreased breath sounds throughout, scattered Rales Abd: soft, nontender, no hepatomegaly  Ext: no edema Musculoskeletal:  No deformities, BUE and BLE strength normal and equal Skin: warm and dry  Neuro:  CNs 2-12 intact, no focal abnormalities noted Psych:  Normal affect   EKG:  The EKG was personally reviewed and demonstrates: Sinus tachycardia rate 126 bpm unable to exclude old anterior MI  Telemetry:  Telemetry was personally reviewed and demonstrates: Sinus tachycardia  Relevant CV  Studies: -TTE 06/19/2024 LVEF 50 to 55%, normal RV size and function, mild MR - TTE 05/2021 normal biventricular function, mildly elevated PASP, no significant valvular disease - CTPA 05/2021 mild coronary artery calcification  Laboratory Data: High Sensitivity Troponin:  No results for input(s): TROPONINIHS in the last 720 hours.   Chemistry Recent Labs  Lab 06/18/24 2024 06/19/24 0425 06/20/24 0422  NA 136 138 140  K 4.6 4.4 4.2  CL 99 101 103  CO2 21* 23 26  GLUCOSE 335* 153* 135*  BUN 9 12 17   CREATININE 0.67 0.60 0.47  CALCIUM 9.2 9.1 9.0  MG  --   --  2.1  GFRNONAA >60 >60 >60  ANIONGAP 17* 14 11    Recent Labs  Lab 06/18/24 2024 06/19/24 0425 06/20/24 0422  PROT 7.6 7.3  --  ALBUMIN 4.2 4.0 4.0  AST 38 42*  --   ALT 37 36  --   ALKPHOS 89 78  --   BILITOT 0.5 0.4  --    Lipids No results for input(s): CHOL, TRIG, HDL, LABVLDL, LDLCALC, CHOLHDL in the last 168 hours.  Hematology Recent Labs  Lab 06/18/24 2024 06/19/24 0425 06/20/24 0422  WBC 21.3* 17.3* 14.9*  RBC 4.26 3.91 3.84*  HGB 12.5 11.5* 11.3*  HCT 38.3 34.1* 34.0*  MCV 89.9 87.2 88.5  MCH 29.3 29.4 29.4  MCHC 32.6 33.7 33.2  RDW 13.7 13.7 13.7  PLT 247 201 202   Thyroid No results for input(s): TSH, FREET4 in the last 168 hours.  BNP Recent Labs  Lab 06/18/24 2024  PROBNP 659.0*    DDimer No results for input(s): DDIMER in the last 168 hours.  Radiology/Studies:  ECHOCARDIOGRAM COMPLETE Result Date: 06/19/2024    ECHOCARDIOGRAM REPORT   Patient Name:   ETTY ISAAC Date of Exam: 06/19/2024 Medical Rec #:  [id number]      Height:       61.0 in Accession #:    [id number]     Weight:       96.1 lb Date of Birth:  [birth year]       BSA:          1.383 m Patient Age:    63 years       BP:           112/69 mmHg Patient Gender: F              HR:           106 bpm. Exam Location:  [facility] Procedure: 2D Echo, Cardiac Doppler and Color Doppler (Both Spectral and Color             Flow Doppler were utilized during procedure). Indications:     CHF--acute diastolic I50.31  History:         Patient has prior history of Echocardiogram examinations, most                  recent 06/26/2021. COPD.  Sonographer:     Christopher Furnace Referring Phys:  [id number] EMERY LITTIE FUSS Diagnosing Phys: Evalene Lunger MD  Sonographer Comments: Technically challenging study due to limited acoustic windows and no apical window. Image acquisition challenging due to COPD. IMPRESSIONS  1. Left ventricular ejection fraction, by estimation, is 50 to 55%. The left ventricle has low normal function. The left ventricle has no regional wall motion abnormalities. Left ventricular diastolic parameters are indeterminate.  2. Right ventricular systolic function is normal. The right ventricular size is normal. Tricuspid regurgitation signal is inadequate for assessing PA pressure.  3. The mitral valve is normal in structure. Mild mitral valve regurgitation. No evidence of mitral stenosis.  4. The aortic valve is normal in structure. Aortic valve regurgitation is not visualized. Aortic valve sclerosis/calcification is present, without any evidence of aortic stenosis.  5. The inferior vena cava is normal in size with greater than 50% respiratory variability, suggesting right atrial pressure of 3 mmHg. FINDINGS  Left Ventricle: Left ventricular ejection fraction, by estimation, is 50 to 55%. The left ventricle has low normal function. The left ventricle has no regional wall motion abnormalities. Strain was performed and the global longitudinal strain is indeterminate. The left ventricular internal cavity size was normal in size. There is no left ventricular hypertrophy. Left ventricular diastolic parameters are indeterminate. Right Ventricle: The right ventricular  size is normal. No increase in right ventricular wall thickness. Right ventricular systolic function is normal. Tricuspid regurgitation signal is inadequate for assessing PA  pressure. Left Atrium: Left atrial size was normal in size. Right Atrium: Right atrial size was normal in size. Pericardium: There is no evidence of pericardial effusion. Mitral Valve: The mitral valve is normal in structure. Mild mitral valve regurgitation. No evidence of mitral valve stenosis. Tricuspid Valve: The tricuspid valve is normal in structure. Tricuspid valve regurgitation is not demonstrated. No evidence of tricuspid stenosis. Aortic Valve: The aortic valve is normal in structure. Aortic valve regurgitation is not visualized. Aortic valve sclerosis/calcification is present, without any evidence of aortic stenosis. Pulmonic Valve: The pulmonic valve was normal in structure. Pulmonic valve regurgitation is not visualized. No evidence of pulmonic stenosis. Aorta: The aortic root is normal in size and structure. Venous: The inferior vena cava is normal in size with greater than 50% respiratory variability, suggesting right atrial pressure of 3 mmHg. IAS/Shunts: No atrial level shunt detected by color flow Doppler. Additional Comments: 3D was performed not requiring image post processing on an independent workstation and was indeterminate.  LEFT VENTRICLE PLAX 2D LVIDd:         3.70 cm LVIDs:         2.40 cm LV PW:         0.90 cm LV IVS:        0.90 cm LVOT diam:     2.00 cm LVOT Area:     3.14 cm  LEFT ATRIUM         Index LA diam:    2.30 cm 1.66 cm/m   AORTA Ao Root diam: 2.70 cm  SHUNTS Systemic Diam: 2.00 cm Evalene Lunger MD Electronically signed by Evalene Lunger MD Signature Date/Time: 06/19/2024/2:39:32 PM    Final    DG Chest Portable 1 View Result Date: 06/18/2024 EXAM: 1 VIEW(S) XRAY OF THE CHEST 06/18/2024 08:40:00 PM COMPARISON: None available. CLINICAL HISTORY: sob FINDINGS: LUNGS AND PLEURA: Hyperinflation of lungs. Emphysema. Superimposed patchy infiltrate of the left lung base in keeping with acute infection or aspiration. Small bilateral pleural effusions. No pneumothorax. HEART AND  MEDIASTINUM: Aortic calcification. No acute abnormality of the cardiac and mediastinal silhouettes. BONES AND SOFT TISSUES: No acute osseous abnormality. IMPRESSION: 1. Hyperinflation of lungs and emphysema. 2. Superimposed patchy infiltrate of the left lung base, consistent with acute infection or aspiration. 3. Small bilateral pleural effusions. Electronically signed by: Dorethia Molt MD 06/18/2024 08:52 PM EST RP Workstation: [id number]     Assessment and Plan: Elevated troponin Coronary artery calcifications Demand ischemia Suspect demand ischemia in the setting of hypoxia, tachycardia.  Patient denies any chest discomfort.  She has known coronary artery calcifications from a CT scan 05/2021.  Echocardiogram this admission shows preserved EF without obvious focal wall motion abnormality.  Recommendations: -Continue aspirin , statin -Since this is likely demand ischemia, would just stop the heparin  for now since there is likely marginal benefit at this point -Continue to treat underlying PNA/COPD exacerbation -We can see her in the outpatient setting for consideration of a coronary evaluation; based on her mild calcification seen on a prior scan, I think a coronary CTA would be interpretable/diagnostic. Would need to be careful with lexiscan given COPD  Shortness of breath/acute on chronic respiratory distress COPD exacerbation, possible pneumonia Long history of smoking, COPD On antibiotics, steroids -Echocardiogram with low normal ejection fraction 50 to 55%, no indication of elevated right heart pressures, IVC is not  dilated - Mucomyst, steroids, broad-spectrum antibiotics, nebulizers every 6  Diabetes type 2 A1c well-controlled 5.6  We will sign off.  For questions or updates, please contact [facility] Please consult [email or web address] for contact info under   Signed, Caron Poser, MD  06/20/2024 8:46 AM

## 2024-06-21 DIAGNOSIS — R000 Tachycardia, unspecified: Secondary | ICD-10-CM

## 2024-06-21 DIAGNOSIS — E440 Moderate protein-calorie malnutrition: Secondary | ICD-10-CM | POA: Insufficient documentation

## 2024-06-21 DIAGNOSIS — A419 Sepsis, unspecified organism: Secondary | ICD-10-CM | POA: Diagnosis not present

## 2024-06-21 DIAGNOSIS — J181 Lobar pneumonia, unspecified organism: Secondary | ICD-10-CM | POA: Diagnosis not present

## 2024-06-21 DIAGNOSIS — J9601 Acute respiratory failure with hypoxia: Secondary | ICD-10-CM | POA: Diagnosis not present

## 2024-06-21 DIAGNOSIS — J441 Chronic obstructive pulmonary disease with (acute) exacerbation: Secondary | ICD-10-CM | POA: Diagnosis not present

## 2024-06-21 DIAGNOSIS — J9621 Acute and chronic respiratory failure with hypoxia: Secondary | ICD-10-CM | POA: Diagnosis not present

## 2024-06-21 LAB — CBC
HCT: 31.1 % — ABNORMAL LOW (ref 36.0–46.0)
Hemoglobin: 10.5 g/dL — ABNORMAL LOW (ref 12.0–15.0)
MCH: 29.7 pg (ref 26.0–34.0)
MCHC: 33.8 g/dL (ref 30.0–36.0)
MCV: 88.1 fL (ref 80.0–100.0)
Platelets: 205 K/uL (ref 150–400)
RBC: 3.53 MIL/uL — ABNORMAL LOW (ref 3.87–5.11)
RDW: 13.7 % (ref 11.5–15.5)
WBC: 13.5 K/uL — ABNORMAL HIGH (ref 4.0–10.5)
nRBC: 0 % (ref 0.0–0.2)

## 2024-06-21 LAB — RENAL FUNCTION PANEL
Albumin: 3.6 g/dL (ref 3.5–5.0)
Anion gap: 10 (ref 5–15)
BUN: 19 mg/dL (ref 8–23)
CO2: 29 mmol/L (ref 22–32)
Calcium: 8.7 mg/dL — ABNORMAL LOW (ref 8.9–10.3)
Chloride: 103 mmol/L (ref 98–111)
Creatinine, Ser: 0.45 mg/dL (ref 0.44–1.00)
GFR, Estimated: >60 mL/min (ref >60)
Glucose, Bld: 83 mg/dL (ref 70–99)
Phosphorus: 2.9 mg/dL (ref 2.5–4.6)
Potassium: 4.1 mmol/L (ref 3.5–5.1)
Sodium: 142 mmol/L (ref 135–145)

## 2024-06-21 LAB — LIPID PANEL
Cholesterol: 198 mg/dL (ref 0–200)
HDL: 65 mg/dL (ref >40)
LDL Cholesterol: 103 mg/dL — ABNORMAL HIGH (ref 0–99)
Total CHOL/HDL Ratio: 3.1 ratio
Triglycerides: 151 mg/dL — ABNORMAL HIGH (ref <150)
VLDL: 30 mg/dL (ref 0–40)

## 2024-06-21 LAB — CULTURE, BLOOD (ROUTINE X 2)

## 2024-06-21 LAB — MAGNESIUM: Magnesium: 2.2 mg/dL (ref 1.7–2.4)

## 2024-06-21 MED ORDER — DIAZEPAM 5 MG/ML IJ SOLN
INTRAMUSCULAR | Status: AC
  Filled 2024-06-21: qty 2

## 2024-06-21 MED ORDER — METHYLPREDNISOLONE SODIUM SUCC 40 MG IJ SOLR
INTRAMUSCULAR | Status: AC
  Administered 2024-06-21: 20 mg via INTRAVENOUS
  Filled 2024-06-21: qty 1

## 2024-06-21 MED ORDER — METHYLPREDNISOLONE SODIUM SUCC 40 MG IJ SOLR: 20.0000 mg | INTRAMUSCULAR | Status: AC

## 2024-06-21 MED ORDER — ARFORMOTEROL TARTRATE 15 MCG/2ML IN NEBU
15.0000 ug | INHALATION_SOLUTION | Freq: Two times a day (BID) | RESPIRATORY_TRACT | Status: DC
  Administered 2024-06-21 – 2024-06-26 (×10): 15 ug via RESPIRATORY_TRACT
  Filled 2024-06-21 (×11): qty 2

## 2024-06-21 MED ORDER — ADULT MULTIVITAMIN W/MINERALS CH
1.0000 | ORAL_TABLET | Freq: Every day | ORAL | Status: DC
  Administered 2024-06-22 – 2024-06-26 (×5): 1 via ORAL
  Filled 2024-06-21 (×6): qty 1

## 2024-06-21 MED ORDER — DIAZEPAM 5 MG/ML IJ SOLN
2.5000 mg | Freq: Once | INTRAMUSCULAR | Status: AC
  Administered 2024-06-21: 2.5 mg via INTRAVENOUS

## 2024-06-21 MED ORDER — ALPRAZOLAM 0.5 MG PO TABS
0.5000 mg | ORAL_TABLET | Freq: Two times a day (BID) | ORAL | Status: DC | PRN
  Administered 2024-06-21 – 2024-06-25 (×8): 0.5 mg via ORAL
  Filled 2024-06-21 (×8): qty 1

## 2024-06-21 MED ORDER — MORPHINE SULFATE (PF) 2 MG/ML IV SOLN
1.0000 mg | INTRAVENOUS | Status: DC
Start: 2024-06-21 — End: 2024-06-21

## 2024-06-21 MED ORDER — MORPHINE SULFATE (CONCENTRATE) 10 MG /0.5 ML PO SOLN
5.0000 mg | ORAL | Status: DC | PRN
  Filled 2024-06-21: qty 0.5

## 2024-06-21 NOTE — Assessment & Plan Note (Signed)
 Continue supplements

## 2024-06-21 NOTE — Plan of Care (Signed)
  Problem: Education: Goal: Knowledge of General Education information will improve Description: Including pain rating scale, medication(s)/side effects and non-pharmacologic comfort measures Outcome: Progressing   Problem: Clinical Measurements: Goal: Ability to maintain clinical measurements within normal limits will improve Outcome: Progressing Goal: Diagnostic test results will improve Outcome: Progressing Goal: Cardiovascular complication will be avoided Outcome: Progressing   Problem: Pain Managment: Goal: General experience of comfort will improve and/or be controlled Outcome: Progressing   Problem: Safety: Goal: Ability to remain free from injury will improve Outcome: Progressing   Problem: Clinical Measurements: Goal: Will remain free from infection Outcome: Not Progressing Goal: Respiratory complications will improve Outcome: Not Progressing   Problem: Activity: Goal: Risk for activity intolerance will decrease Outcome: Not Progressing   Problem: Nutrition: Goal: Adequate nutrition will be maintained Outcome: Not Progressing   Problem: Coping: Goal: Level of anxiety will decrease Outcome: Not Progressing   Problem: Elimination: Goal: Will not experience complications related to bowel motility Outcome: Not Progressing Goal: Will not experience complications related to urinary retention Outcome: Not Progressing

## 2024-06-21 NOTE — Evaluation (Signed)
 Physical Therapy Evaluation Patient Details Name: Gina Bates MRN: [id number] DOB: Aug 06, 1960 Today's Date: 06/21/2024  History of Present Illness  Iviona Hole is a 63 y.o. female with medical history significant of COPD, elevated troponin history, who presents to the ER with sudden onset of shortness of breath and hypoxia.  Patient brought in by EMS on CPAP.  Oxygen sat was 80% on room air apparently when they arrived.  Patient has received albuterol  and Solu-Medrol .  Still having significant shortness of breath.  Patient is in the tripod position right now.  Workup in the ER showed suspected pneumonia but also fluid overload.  Suspected new CHF.  Patient has been placed on BiPAP.  Additionally troponin was noted to be more than 100.  No chest pain.  No peripheral edema.  Patient has been admitted with acute on chronic hypoxic respiratory failure.  Clinical Impression  Pt is a pleasant 63 y.o. female admitted d/t acute respiratory distress. Pt was received in bed asleep with son at bedside. Upon waking, pt was agreeable to therapy session. Pt was able to mobilize to EOB without any physical assist or vc. Upon sitting EOB, she was able to utilize BUE to lift, shift, and lower to scoot towards HOB. Despite edu on role of PT in acute care, pt decline to perform any standing activities at this time. Per monitor, pts' RR remained low 20's on 30L HFNC. Pt was deemed safe to remain at EOB with UE prop on bedside table. Pt will continue to benefit from skilled PT services to address functional decline since being in ICU.        If plan is discharge home, recommend the following: A little help with walking and/or transfers   Can travel by private vehicle        Equipment Recommendations Rolling walker (2 wheels);BSC/3in1  Recommendations for Other Services  OT consult    Functional Status Assessment Patient has had a recent decline in their functional status and demonstrates the ability to make  significant improvements in function in a reasonable and predictable amount of time.     Precautions / Restrictions Precautions Precautions: Fall Recall of Precautions/Restrictions: Intact Restrictions Weight Bearing Restrictions Per Provider Order: No      Mobility  Bed Mobility Overal bed mobility: Needs Assistance Bed Mobility: Supine to Sit     Supine to sit: Supervision     General bed mobility comments: able to mobilize to EOB without physical assist    Transfers                   General transfer comment: NT - EOB highest level achieved    Ambulation/Gait               General Gait Details: NT - EOB highest level achieved  Stairs            Wheelchair Mobility     Tilt Bed    Modified Rankin (Stroke Patients Only)       Balance Overall balance assessment: Needs assistance Sitting-balance support: Feet unsupported, Bilateral upper extremity supported Sitting balance-Leahy Scale: Fair Sitting balance - Comments: able to prop on bedside table at EOB with supervision       Standing balance comment: NT - EOB highest level achieved                             Pertinent Vitals/Pain Pain Assessment Pain Assessment: No/denies pain  Home Living Family/patient expects to be discharged to:: Private residence Living Arrangements: Alone Available Help at Discharge: Family Type of Home: House Home Access: Stairs to enter Entrance Stairs-Rails: Can reach both Entrance Stairs-Number of Steps: 3   Home Layout: Two level Home Equipment: None Additional Comments: Pt reports she can live on first floor while recovering    Prior Function Prior Level of Function : Independent/Modified Independent             Mobility Comments: IND with mobility ADLs Comments: IND with ADLs     Extremity/Trunk Assessment   Upper Extremity Assessment Upper Extremity Assessment: Overall WFL for tasks assessed    Lower Extremity  Assessment Lower Extremity Assessment: Overall WFL for tasks assessed    Cervical / Trunk Assessment Cervical / Trunk Assessment: Kyphotic  Communication   Communication Communication: No apparent difficulties    Cognition Arousal: Alert Behavior During Therapy: WFL for tasks assessed/performed   PT - Cognitive impairments: No apparent impairments                         Following commands: Intact       Cueing Cueing Techniques: Verbal cues     General Comments      Exercises Other Exercises Other Exercises: edu on benefit of PT in acute setting   Assessment/Plan    PT Assessment Patient needs continued PT services  PT Problem List Decreased strength;Decreased activity tolerance;Decreased balance;Decreased mobility;Decreased knowledge of use of DME       PT Treatment Interventions Gait training;Stair training;Functional mobility training;Therapeutic activities;Therapeutic exercise;Balance training    PT Goals (Current goals can be found in the Care Plan section)  Acute Rehab PT Goals Patient Stated Goal: none stated PT Goal Formulation: With patient/family Time For Goal Achievement: 07/05/24 Potential to Achieve Goals: Good    Frequency Min 2X/week     Co-evaluation               AM-PAC PT 6 Clicks Mobility  Outcome Measure Help needed turning from your back to your side while in a flat bed without using bedrails?: None Help needed moving from lying on your back to sitting on the side of a flat bed without using bedrails?: None Help needed moving to and from a bed to a chair (including a wheelchair)?: A Little Help needed standing up from a chair using your arms (e.g., wheelchair or bedside chair)?: A Little Help needed to walk in hospital room?: A Little Help needed climbing 3-5 steps with a railing? : A Lot 6 Click Score: 19    End of Session Equipment Utilized During Treatment: Oxygen Activity Tolerance: Patient limited by  fatigue Patient left: in bed;with call bell/phone within reach;with chair alarm set;with family/visitor present Nurse Communication: Mobility status PT Visit Diagnosis: Muscle weakness (generalized) (M62.81);Other abnormalities of gait and mobility (R26.89);Unsteadiness on feet (R26.81);Difficulty in walking, not elsewhere classified (R26.2)    Time: 8587-8574 PT Time Calculation (min) (ACUTE ONLY): 13 min   Charges:   PT Evaluation $PT Eval Moderate Complexity: 1 Mod   PT General Charges $$ ACUTE PT VISIT: 1 Visit         Allena Bulls, SPT   Allena Bulls 06/21/2024, 4:12 PM

## 2024-06-21 NOTE — Progress Notes (Signed)
 Called earlier with sinus tachycardia and tripoding Patient having more difficulty with her breathin  Patient was on heated high flow nasal canula. Placed on Bipap  Hr a little better and patient breathing a little bit better.  Explained if these modalities dont work then intubation is the next step. Patient wants to try and avoid intubation  Case discussed with nursing staff and critical care team  Came back to bedside to speak with family and patient  Dr Charlie Patterson 15 minutes

## 2024-06-21 NOTE — Plan of Care (Signed)
 Continuing with plan of care.

## 2024-06-21 NOTE — Progress Notes (Addendum)
 Notified patient that she would be moving to room 252 and patient became anxious, dyspneic and tripoding. PRN morphine  given with no improvement noted. Almarie, NP at bedside. Charge, RN at bedside. Patient agreed to stay until morning and try at a later time. Patient continued to be anxious, PRN Xanax  given per order. Daughter at bedside. VSS. Continue to monitor patient closely.

## 2024-06-21 NOTE — Progress Notes (Signed)
 Progress Note   Patient: Gina Bates FMW:[id number] DOB: [birth year] DOA: 06/18/2024     3 DOS: the patient was seen and examined on 06/21/2024   Brief hospital course:  63 y.o. female with medical history significant of COPD, elevated troponin history, who presents to the ER with sudden onset of shortness of breath and hypoxia.  Patient brought in by EMS on CPAP.  Oxygen sat was 80% on room air apparently when they arrived.  Patient has received albuterol  and Solu-Medrol .  Still having significant shortness of breath.  Patient is in the tripod position right now.  Workup in the ER showed suspected pneumonia but also fluid overload.  Suspected new CHF.  Patient has been placed on BiPAP.  Additionally troponin was noted to be more than 100.  No chest pain.  No peripheral edema.  Patient has been admitted with acute on chronic hypoxic respiratory failure.   11/20.  Patient admitted with respiratory distress and placed on BiPAP.  We tried to take off BiPAP this morning but needed to go back on BiPAP secondary to shortness of breath.  Continue antibiotics for pneumonia and Solu-Medrol .  Patient initially placed on heparin  drip secondary to elevated troponin.  No complaints of chest pain. 11/21.  Patient required BiPAP and an extra dose of Solu-Medrol  last night.  Respiratory panel positive for rhinovirus.  Streptococcus pneumoniae urinary antigen positive.  Patient still feels short of breath.  Assessment and Plan: * Acute respiratory distress Patient placed on BiPAP on presentation.  Patient still with using accessory muscles to breathe.  On heated high flow nasal cannula 40 L flow and 40% oxygen with good saturations.  Will try Roxanol for air hunger.  On Xanax  as needed.  Acute on chronic respiratory failure with hypoxia [facility]) Patient requiring BiPAP on presentation.  Currently on heated high flow nasal cannula 40% oxygen and 40 L flow.  Using accessory muscles.  Normally wears 2 L of  oxygen.  Severe sepsis (HCC) Present on admission with tachycardia, tachypnea, leukocytosis and lobar pneumonia and lactic acidosis.  Continue Rocephin  and Zithromax .  Respiratory viral panel positive for rhinovirus.  Urinary Streptococcus pneumoniae antigen positive.  Lobar pneumonia Continue Rocephin  and Zithromax .  1 bottle positive for staph hominis which is likely contamination.  Urinary Streptococcus pneumoniae antigen positive.  COPD exacerbation (HCC) Continue IV Solu-Medrol  and nebulizer treatments.  Malnutrition of moderate degree Continue supplements  Pressure injury of skin From continuous BiPAP. Wound 06/20/24 1217 Pressure Injury Nose Mid Stage 2 -  Partial thickness loss of dermis presenting as a shallow open injury with a red, pink wound bed without slough. (Active)      Myocardial injury I suspect demand ischemia from respiratory distress pneumonia and COPD exacerbation.  Heparin  drip started on admission with elevated troponin, continue aspirin  and Zocor .  Add on an LDL.  Echocardiogram shows an EF of 50 to 55%.  Lactic acidosis Improved      Subjective: Patient on heated high flow nasal cannula 40% oxygen and 40 L flow.  Still having shortness of breath.  Still having cough.  Admitted with COPD exacerbation respiratory distress  Physical Exam: Vitals:   06/21/24 0630 06/21/24 0700 06/21/24 0705 06/21/24 0748  BP:   115/77   Pulse: (!) 124 (!) 102 (!) 105   Resp: (!) 26 (!) 21 20   Temp:      TempSrc:      SpO2: 93% 93% 95% 95%  Weight:      Height:  Physical Exam HENT:     Head: Normocephalic.     Mouth/Throat:     Pharynx: No oropharyngeal exudate.  Eyes:     General: Lids are normal.     Conjunctiva/sclera: Conjunctivae normal.  Cardiovascular:     Rate and Rhythm: Normal rate and regular rhythm.     Heart sounds: Normal heart sounds, S1 normal and S2 normal.  Pulmonary:     Effort: Accessory muscle usage present.     Breath sounds:  Examination of the right-lower field reveals decreased breath sounds and rhonchi. Examination of the left-lower field reveals decreased breath sounds and rhonchi. Decreased breath sounds and rhonchi present. No wheezing or rales.  Abdominal:     Palpations: Abdomen is soft.     Tenderness: There is no abdominal tenderness.  Musculoskeletal:     Right lower leg: No swelling.     Left lower leg: No swelling.  Skin:    General: Skin is warm.     Findings: No rash.  Neurological:     Mental Status: She is alert and oriented to person, place, and time.     Data Reviewed: Creatinine 0.45, white blood cell count 13.5, hemoglobin 10.5, platelet count 205  Family Communication: Son at the bedside  Disposition: Status is: Inpatient Remains inpatient appropriate because: Patient still using accessory muscles to breathe.  On heated high flow nasal cannula.  Will continue to watch in the stepdown unit.  Try Roxanol as needed for air hunger.  Planned Discharge Destination: Home with Home Health    Time spent: 28 minutes  Author: Charlie Patterson, MD 06/21/2024 12:57 PM  For on call review [email or web address].

## 2024-06-21 NOTE — Progress Notes (Signed)
 NAME:  Gina Bates, MRN:  [id number], DOB:  10-13-1960, LOS: 3 ADMISSION DATE:  06/18/2024, CONSULTATION DATE:  06/19/2024 REFERRING MD:  Dr. Josette, CHIEF COMPLAINT:  Acute Hypoxic Respiratory Failure    Brief Pt Description / Synopsis:  63 y.o. female with PMHx significant for COPD admitted with Acute on Chronic Hypoxic Respiratory Failure in the setting of AECOPD, Strep Pneumoniae Pneumonia, and Rhinovirus infection requiring BiPAP.   History of Present Illness:  Gina Bates is a 63 y.o. female with a past medical history significant for COPD who presented to [facility] ED on 06/18/2024 due to complaints of sudden onset of shortness of breath and hypoxia.    On EMS arrival she was found to be hypoxia with O2 sts of 80% on room air, of which she was given albuterol  and Solumedrol, and placed on CPAP.  Upon arrival to the ED she was still exhibiting significant shortness of breath and in the tripod position, of which she was placed on BiPAP.  She denied peripheral edema.   ED Course: Initial Vital Signs: Temperature 98.3, blood pressure 120/73, pulse 126 respirate 29 oxygen sat 99% on BiPAP FiO2 50%  Significant Labs: WBC 21.3, glucose 335, Pro BNP 659, HS Troponin 127, lactic acid 2.5 VBG: pH 7.27/pO2 171/pCO2 51/ Bicarb 23.4 COVID/FLU/RSV PCR negative  Imaging Chest X-ray>>IMPRESSION: 1. Hyperinflation of lungs and emphysema. 2. Superimposed patchy infiltrate of the left lung base, consistent with acute infection or aspiration. 3. Small bilateral pleural effusions. Medications Administered:   [facility] asked to admit for further workup and treatment.  Please see Significant Hospital Events section below for full detailed hospital course.   Pertinent  Medical History   Past Medical History:  Diagnosis Date   COPD (chronic obstructive pulmonary disease) (HCC)     Micro Data:  11/19: Blood cultures x2>> 1/4 bottles with Staphylococcus species (? Contaminant ) 11/19: COVID/FLU/RSV  PCR>> negative 11/20: RVP>> + Rhinovirus 11/20: Sputum>> 11/20: Strep pneumo urinary antigen + 11/20: Legionella urinary antigen>>  Antimicrobials:   Anti-infectives (From admission, onward)    Start     Dose/Rate Route Frequency Ordered Stop   06/19/24 2200  cefTRIAXone  (ROCEPHIN ) 2 g in sodium chloride  0.9 % 100 mL IVPB        2 g 200 mL/hr over 30 Minutes Intravenous Every 24 hours 06/18/24 2241 06/24/24 2159   06/18/24 2245  azithromycin  (ZITHROMAX ) 500 mg in sodium chloride  0.9 % 250 mL IVPB        500 mg 250 mL/hr over 60 Minutes Intravenous Every 24 hours 06/18/24 2241 06/23/24 2244   06/18/24 2130  cefTRIAXone  (ROCEPHIN ) 2 g in sodium chloride  0.9 % 100 mL IVPB        2 g 200 mL/hr over 30 Minutes Intravenous Once 06/18/24 2115 06/18/24 2252   06/18/24 2130  azithromycin  (ZITHROMAX ) 500 mg in sodium chloride  0.9 % 250 mL IVPB  Status:  Discontinued        500 mg 250 mL/hr over 60 Minutes Intravenous  Once 06/18/24 2115 06/18/24 2245       Significant Hospital Events: Including procedures, antibiotic start and stop dates in addition to other pertinent events   11/19: Admitted by [facility] for Acute on Chronic Hypoxic Respiratory Failure due to AECOPD, pneumonia, and questionable CHF requiring BiPAP. 11/20: Remains on BiPAP, PCCM and Cardiology consulted. Refused CTa Chest to rule out PE. 11/21: No significant events overnight. Afebrile, hemodynamically stable, tolerated BiPAP overnight.  Will attempt to wean off BiPAP as tolerated.  Strep pneumo urinary antigen came back positive, along with rhinovirus. Heparin  gtt d/c by Cardiology and Cardiology signing off. Pt very anxious and apprehensive about removing BiPAP, will give morphine  2 mg x1 dose and trial HHFNC. 11/22: Severe anxiety when attempt to transition off of bipap. Morphine  given without relief, Xanax  administered and anxiety significantly improved. Now on HHFNC this am.   Interim History / Subjective:  As outlined above  under Significant Hospital Events section  Objective   Blood pressure 115/77, pulse (!) 105, temperature 98.4 F (36.9 C), temperature source Oral, resp. rate 20, height 5' (1.524 m), weight 47.1 kg, SpO2 95%.    FiO2 (%):  [28 %-40 %] 40 %   Intake/Output Summary (Last 24 hours) at 06/21/2024 9165 Last data filed at 06/21/2024 0700 Gross per 24 hour  Intake 612.84 ml  Output 850 ml  Net -237.16 ml   Filed Weights   06/18/24 2012 06/19/24 2206  Weight: 43.6 kg 47.1 kg    Examination: General:  ill appearing female, lying in bed on HHFNC in NAD HENT: Atraumatic, normocephalic, neck supple, no JVD Lungs: Diminished breath sounds throughout with faint expiratory wheezing to bilateral bases, now on HHFNC Cardiovascular: RRR, s1s2, no M/R/G Abdomen: Soft, nontender, nondistended, no guarding or rebound tenderness, BS+ x4 Extremities: Normal bulk and tone, no deformities, warm, dry, radial and distal pulses 2+, no edema Neuro: Awake and alert, oriented x4, calm, moves all extremities purposefully, no focal deficits noted, PERRL GU: deferred, voiding   Resolved Hospital Problem list     Assessment & Plan:   #Anxiety - Treatment of metabolic derangements as outlined above - Provide supportive care - Promote normal sleep/wake cycle and family presence - Avoid sedating medications as able - Morphine  ineffective for anxiety, continue Xanax  prn  #Acute on Chronic Hypoxic Respiratory Failure s/t #Acute COPD Exacerbation #Community Acquired Pneumonia PMHx: COPD, prescribed for 2L supplemental Oxygen - Supplemental O2 to maintain sats >90-92% - Alternate Bipap and HHFNC prn - Wean FiO2 and PEEP as able - Ensure adequate pulmonary hygiene - Duonebs q6h - Continue methyprednisolone 40mg  daily and Pulmicort  twice daily - Continue Ceftriaxone  and Azithromycin   #Elevated Troponin due to demand ischemia ~Resolved Hx: Hyperlipidemia, Coronary Artery Calcifications Echocardiogram  06/19/24: LVEF 50-55%, indeterminate diastolic parameters, RV systolic function and RV size both normal - Continuous cardiac monitoring - Vasopressors to maintain MAP >65 ~ not currently requiring - Troponin peaked at 278 - Continue aspirin  and statin  #AGMA s/t CAP with Lactic Acidosis ~ Resolved - Trend BMP and monitor renal function - Ensure adequate renal perfusion - Avoid nephrotoxins as able - ICU electrolyte replacement protocol - Pharmacy to assist with replacement - Lactic acid normalized  #Met SIRS Criteria on presentation (Pulse 125, RR 24, WBC 21.3) #Sepsis due to ... #Community Acquired Pneumonia: Strep Pneumoniae Pneumonia #Rhinovirus infection  - Trend WBC and monitor fever curve - Leukocytosis improving - Blood cultures x 2, no growth to date - Continue azithromycin  and ceftriaxone   #DVT Prophylaxis - Continue Lovenox    Best Practice (right click and Reselect all SmartList Selections daily)   Diet/type: NPO until maintaining off BiPAP  DVT prophylaxis: Lovenox   GI prophylaxis: N/A Lines: N/A Foley:  N/A Code Status:  full code Last date of multidisciplinary goals of care discussion [11/21]  11/21: Pt, her daughter and granddaugher updated at bedside on plan of care.  Labs   CBC: Recent Labs  Lab 06/18/24 2024 06/19/24 0425 06/20/24 0422 06/21/24 0336  WBC 21.3* 17.3*  14.9* 13.5*  NEUTROABS 14.9*  --   --   --   HGB 12.5 11.5* 11.3* 10.5*  HCT 38.3 34.1* 34.0* 31.1*  MCV 89.9 87.2 88.5 88.1  PLT 247 201 202 205    Basic Metabolic Panel: Recent Labs  Lab 06/18/24 2024 06/19/24 0425 06/20/24 0422 06/21/24 0336  NA 136 138 140 142  K 4.6 4.4 4.2 4.1  CL 99 101 103 103  CO2 21* 23 26 29   GLUCOSE 335* 153* 135* 83  BUN 9 12 17 19   CREATININE 0.67 0.60 0.47 0.45  CALCIUM 9.2 9.1 9.0 8.7*  MG  --   --  2.1 2.2  PHOS  --   --  2.0* 2.9   GFR: Estimated Creatinine Clearance: 51.7 mL/min (by C-G formula based on SCr of 0.45  mg/dL). Recent Labs  Lab 06/18/24 2024 06/18/24 2256 06/19/24 0425 06/19/24 1146 06/19/24 1407 06/20/24 0422 06/21/24 0336  WBC 21.3*  --  17.3*  --   --  14.9* 13.5*  LATICACIDVEN  --  2.5*  --  1.6 1.0  --   --     Liver Function Tests: Recent Labs  Lab 06/18/24 2024 06/19/24 0425 06/20/24 0422 06/21/24 0336  AST 38 42*  --   --   ALT 37 36  --   --   ALKPHOS 89 78  --   --   BILITOT 0.5 0.4  --   --   PROT 7.6 7.3  --   --   ALBUMIN 4.2 4.0 4.0 3.6   No results for input(s): LIPASE, AMYLASE in the last 168 hours. No results for input(s): AMMONIA in the last 168 hours.  ABG    Component Value Date/Time   HCO3 23.4 06/18/2024 2024   ACIDBASEDEF 4.0 (H) 06/18/2024 2024   O2SAT 100 06/18/2024 2024     Coagulation Profile: Recent Labs  Lab 06/18/24 2024  INR 1.0    Cardiac Enzymes: No results for input(s): CKTOTAL, CKMB, CKMBINDEX, TROPONINI in the last 168 hours.  HbA1C: Hgb A1c MFr Bld  Date/Time Value Ref Range Status  06/19/2024 04:25 AM 5.6 4.8 - 5.6 % Final    Comment:    (NOTE) Diagnosis of Diabetes The following HbA1c ranges recommended by the American Diabetes Association (ADA) may be used as an aid in the diagnosis of diabetes mellitus.  Hemoglobin             Suggested A1C NGSP%              Diagnosis  <5.7                   Non Diabetic  5.7-6.4                Pre-Diabetic  >6.4                   Diabetic  <7.0                   Glycemic control for                       adults with diabetes.    06/24/2021 06:13 AM 5.8 (H) 4.8 - 5.6 % Final    Comment:    (NOTE)         Prediabetes: 5.7 - 6.4         Diabetes: >6.4         Glycemic control for adults  with diabetes: <7.0     CBG: Recent Labs  Lab 06/19/24 2206  GLUCAP 152*    Review of Systems:   Positives in BOLD: Gen: Denies fever, chills, weight change, fatigue, night sweats, anxious  HEENT: Denies blurred vision, double vision, hearing loss,  tinnitus, sinus congestion, rhinorrhea, sore throat, neck stiffness, dysphagia PULM: Denies shortness of breath, cough, sputum production, hemoptysis, wheezing CV: Denies chest pain, edema, orthopnea, paroxysmal nocturnal dyspnea, palpitations GI: Denies abdominal pain, nausea, vomiting, diarrhea, hematochezia, melena, constipation, change in bowel habits GU: Denies dysuria, hematuria, polyuria, oliguria, urethral discharge Endocrine: Denies hot or cold intolerance, polyuria, polyphagia or appetite change Derm: Denies rash, dry skin, scaling or peeling skin change Heme: Denies easy bruising, bleeding, bleeding gums Neuro: Denies headache, numbness, weakness, slurred speech, loss of memory or consciousness   Past Medical History:  She,  has a past medical history of COPD (chronic obstructive pulmonary disease) (HCC).   Surgical History:  History reviewed. No pertinent surgical history.   Social History:   reports that she has quit smoking. Her smoking use included cigarettes. She has never used smokeless tobacco. She reports that she does not currently use alcohol. She reports that she does not use drugs.   Family History:  Her family history is not on file.   Allergies No Known Allergies   Home Medications  Prior to Admission medications   Medication Sig Start Date End Date Taking? Authorizing Provider  albuterol  (PROVENTIL ) (2.5 MG/3ML) 0.083% nebulizer solution Take 3 mLs (2.5 mg total) by nebulization every 6 (six) hours as needed for wheezing or shortness of breath. 06/28/21  Yes Wieting, Richard, MD  albuterol  (VENTOLIN  HFA) 108 (90 Base) MCG/ACT inhaler Inhale 2 puffs into the lungs every 6 (six) hours as needed for shortness of breath. 06/28/21  Yes Wieting, Richard, MD  fluticasone-salmeterol (ADVAIR) 250-50 MCG/ACT AEPB Inhale 1 puff into the lungs 2 (two) times daily. 06/08/21  Yes [provider]  Multiple Vitamin (MULTIVITAMIN WITH MINERALS) TABS tablet Take 1  tablet by mouth daily. 06/29/21  Yes Wieting, Richard, MD  simvastatin  (ZOCOR ) 40 MG tablet Take 40 mg by mouth at bedtime. 05/30/21  Yes [provider]  SPIRIVA HANDIHALER 18 MCG inhalation capsule Place 1 capsule into inhaler and inhale in the morning. 06/08/21  Yes [provider]  Tiotropium Bromide (SPIRIVA HANDIHALER) 18 MCG CAPS Place 18 mcg into inhaler and inhale. 05/28/24 08/26/24 Yes [provider]     Care time: 50 minutes      Yasaman Kolek, PA-C Pulmonary/Critical Care PCCM Team Contact Info: [phone]

## 2024-06-21 NOTE — Progress Notes (Signed)
 Towards the end of the shift when patient's bed linens were changed patient's anxiety became elevated causing patient to desaturate to the low 80s requiring being placed back on bi-pap, HR in the 140s, Rrs in the 40s.  Providers notified and came to bedside, family updated and medications ordered by providers administered.

## 2024-06-22 DIAGNOSIS — R000 Tachycardia, unspecified: Secondary | ICD-10-CM

## 2024-06-22 DIAGNOSIS — J9621 Acute and chronic respiratory failure with hypoxia: Secondary | ICD-10-CM | POA: Diagnosis not present

## 2024-06-22 DIAGNOSIS — A419 Sepsis, unspecified organism: Secondary | ICD-10-CM | POA: Diagnosis not present

## 2024-06-22 DIAGNOSIS — J441 Chronic obstructive pulmonary disease with (acute) exacerbation: Secondary | ICD-10-CM | POA: Diagnosis not present

## 2024-06-22 DIAGNOSIS — J181 Lobar pneumonia, unspecified organism: Secondary | ICD-10-CM | POA: Diagnosis not present

## 2024-06-22 DIAGNOSIS — F419 Anxiety disorder, unspecified: Secondary | ICD-10-CM | POA: Insufficient documentation

## 2024-06-22 LAB — RENAL FUNCTION PANEL
Albumin: 3.9 g/dL (ref 3.5–5.0)
Anion gap: 10 (ref 5–15)
BUN: 22 mg/dL (ref 8–23)
CO2: 29 mmol/L (ref 22–32)
Calcium: 9 mg/dL (ref 8.9–10.3)
Chloride: 101 mmol/L (ref 98–111)
Creatinine, Ser: 0.46 mg/dL (ref 0.44–1.00)
GFR, Estimated: >60 mL/min (ref >60)
Glucose, Bld: 115 mg/dL — ABNORMAL HIGH (ref 70–99)
Phosphorus: 2.8 mg/dL (ref 2.5–4.6)
Potassium: 4.4 mmol/L (ref 3.5–5.1)
Sodium: 140 mmol/L (ref 135–145)

## 2024-06-22 LAB — CBC
HCT: 34.4 % — ABNORMAL LOW (ref 36.0–46.0)
Hemoglobin: 11 g/dL — ABNORMAL LOW (ref 12.0–15.0)
MCH: 28.6 pg (ref 26.0–34.0)
MCHC: 32 g/dL (ref 30.0–36.0)
MCV: 89.4 fL (ref 80.0–100.0)
Platelets: 217 K/uL (ref 150–400)
RBC: 3.85 MIL/uL — ABNORMAL LOW (ref 3.87–5.11)
RDW: 13.4 % (ref 11.5–15.5)
WBC: 9 K/uL (ref 4.0–10.5)
nRBC: 0 % (ref 0.0–0.2)

## 2024-06-22 LAB — LDL CHOLESTEROL, DIRECT: Direct LDL: 84 mg/dL (ref 0–99)

## 2024-06-22 MED ORDER — ESCITALOPRAM OXALATE 10 MG PO TABS
10.0000 mg | ORAL_TABLET | Freq: Every day | ORAL | Status: DC
  Administered 2024-06-22 – 2024-06-26 (×5): 10 mg via ORAL
  Filled 2024-06-22 (×5): qty 1

## 2024-06-22 MED ORDER — AZITHROMYCIN 250 MG PO TABS
500.0000 mg | ORAL_TABLET | Freq: Every day | ORAL | Status: AC
  Administered 2024-06-22: 500 mg via ORAL
  Filled 2024-06-22 (×2): qty 2

## 2024-06-22 NOTE — Progress Notes (Signed)
 OT Cancellation Note  Patient Details Name: Gina Bates MRN: [id number] DOB: Jan 16, 1961   Cancelled Treatment:    Reason Eval/Treat Not Completed: Patient declined, no reason specified;Fatigue/lethargy limiting ability to participate. Chart reviewed, attempted x2, pt reports fatigue and requests to rest. Will hold and initiate services next date as pt appropriate.   Elston Slot, M.S. OTR/L  06/22/24, 3:55 PM  ascom [phone]

## 2024-06-22 NOTE — Progress Notes (Signed)
 NAME:  Gina Bates, MRN:  [id number], DOB:  13-Jul-1961, LOS: 4 ADMISSION DATE:  06/18/2024, CONSULTATION DATE:  06/19/2024 REFERRING MD:  Dr. Josette, CHIEF COMPLAINT:  Acute Hypoxic Respiratory Failure    Brief Pt Description / Synopsis:  63 y.o. female with PMHx significant for COPD admitted with Acute on Chronic Hypoxic Respiratory Failure in the setting of AECOPD, Strep Pneumoniae Pneumonia, and Rhinovirus infection requiring BiPAP.   History of Present Illness:  Gina Bates is a 63 y.o. female with a past medical history significant for COPD who presented to [facility] ED on 06/18/2024 due to complaints of sudden onset of shortness of breath and hypoxia.    On EMS arrival she was found to be hypoxia with O2 sts of 80% on room air, of which she was given albuterol  and Solumedrol, and placed on CPAP.  Upon arrival to the ED she was still exhibiting significant shortness of breath and in the tripod position, of which she was placed on BiPAP.  She denied peripheral edema.   ED Course: Initial Vital Signs: Temperature 98.3, blood pressure 120/73, pulse 126 respirate 29 oxygen sat 99% on BiPAP FiO2 50%  Significant Labs: WBC 21.3, glucose 335, Pro BNP 659, HS Troponin 127, lactic acid 2.5 VBG: pH 7.27/pO2 171/pCO2 51/ Bicarb 23.4 COVID/FLU/RSV PCR negative  Imaging Chest X-ray>>IMPRESSION: 1. Hyperinflation of lungs and emphysema. 2. Superimposed patchy infiltrate of the left lung base, consistent with acute infection or aspiration. 3. Small bilateral pleural effusions. Medications Administered:   [facility] asked to admit for further workup and treatment.  Please see Significant Hospital Events section below for full detailed hospital course.   Pertinent  Medical History   Past Medical History:  Diagnosis Date   COPD (chronic obstructive pulmonary disease) (HCC)     Micro Data:  11/19: Blood cultures x2>> 1/4 bottles with Staphylococcus species (? Contaminant ) 11/19: COVID/FLU/RSV  PCR>> negative 11/20: RVP>> + Rhinovirus 11/20: Sputum>> 11/20: Strep pneumo urinary antigen + 11/20: Legionella urinary antigen>>  Antimicrobials:   Anti-infectives (From admission, onward)    Start     Dose/Rate Route Frequency Ordered Stop   06/22/24 2200  azithromycin  (ZITHROMAX ) tablet 500 mg        500 mg Oral Daily at bedtime 06/22/24 0931 06/23/24 2159   06/19/24 2200  cefTRIAXone  (ROCEPHIN ) 2 g in sodium chloride  0.9 % 100 mL IVPB        2 g 200 mL/hr over 30 Minutes Intravenous Every 24 hours 06/18/24 2241 06/24/24 2159   06/18/24 2245  azithromycin  (ZITHROMAX ) 500 mg in sodium chloride  0.9 % 250 mL IVPB  Status:  Discontinued        500 mg 250 mL/hr over 60 Minutes Intravenous Every 24 hours 06/18/24 2241 06/22/24 0931   06/18/24 2130  cefTRIAXone  (ROCEPHIN ) 2 g in sodium chloride  0.9 % 100 mL IVPB        2 g 200 mL/hr over 30 Minutes Intravenous Once 06/18/24 2115 06/18/24 2252   06/18/24 2130  azithromycin  (ZITHROMAX ) 500 mg in sodium chloride  0.9 % 250 mL IVPB  Status:  Discontinued        500 mg 250 mL/hr over 60 Minutes Intravenous  Once 06/18/24 2115 06/18/24 2245       Significant Hospital Events: Including procedures, antibiotic start and stop dates in addition to other pertinent events   11/19: Admitted by [facility] for Acute on Chronic Hypoxic Respiratory Failure due to AECOPD, pneumonia, and questionable CHF requiring BiPAP. 11/20: Remains on BiPAP, PCCM  and Cardiology consulted. Refused CTa Chest to rule out PE. 11/21: No significant events overnight. Afebrile, hemodynamically stable, tolerated BiPAP overnight.  Will attempt to wean off BiPAP as tolerated.  Strep pneumo urinary antigen came back positive, along with rhinovirus. Heparin  gtt d/c by Cardiology and Cardiology signing off. Pt very anxious and apprehensive about removing BiPAP, will give morphine  2 mg x1 dose and trial HHFNC. 11/22: Severe anxiety when attempt to transition off of bipap. Morphine  given  without relief, Xanax  administered and anxiety significantly improved. Now on HHFNC this am.  11/23: Currently on HHFNC @ 30%. HD stable with intermittent tachycardia.   Interim History / Subjective:  As outlined above under Significant Hospital Events section  Objective   Blood pressure 134/84, pulse (!) 101, temperature 98.6 F (37 C), temperature source Axillary, resp. rate 17, height 5' (1.524 m), weight 47.1 kg, SpO2 92%.    FiO2 (%):  [30 %-40 %] 30 % PEEP:  [5 cmH20] 5 cmH20 Pressure Support:  [5 cmH20] 5 cmH20   Intake/Output Summary (Last 24 hours) at 06/22/2024 1639 Last data filed at 06/22/2024 0600 Gross per 24 hour  Intake 470.24 ml  Output 250 ml  Net 220.24 ml   Filed Weights   06/18/24 2012 06/19/24 2206  Weight: 43.6 kg 47.1 kg    Examination: General:  ill appearing female, lying in bed on HHFNC in NAD HENT: Atraumatic, normocephalic, neck supple, no JVD Lungs: Diminished breath sounds throughout with faint expiratory wheezing to bilateral bases, now on HHFNC Cardiovascular: RRR, s1s2, no M/R/G Abdomen: Soft, nontender, nondistended, no guarding or rebound tenderness, BS+ x4 Extremities: Normal bulk and tone, no deformities, warm, dry, radial and distal pulses 2+, no edema Neuro: Awake and alert, oriented x4, calm, moves all extremities purposefully, no focal deficits noted, PERRL GU: deferred, voiding   Resolved Hospital Problem list     Assessment & Plan:   #Anxiety - Treatment of metabolic derangements as outlined above - Provide supportive care - Promote normal sleep/wake cycle and family presence - Avoid sedating medications as able - Continue morphine  and xanax  for anxiety  #Acute on Chronic Hypoxic Respiratory Failure s/t #Acute COPD Exacerbation #Community Acquired Pneumonia PMHx: COPD, prescribed for 2L supplemental Oxygen - Supplemental O2 to maintain sats >90-92% - Alternate Bipap and HHFNC prn - Wean FiO2 and PEEP as able; has  maintained well on 30% today - Ensure adequate pulmonary hygiene - Duonebs q6h - Continue methyprednisolone 40mg  daily and Pulmicort  twice daily - Continue Ceftriaxone  and Azithromycin   #Elevated Troponin due to demand ischemia ~Resolved Hx: Hyperlipidemia, Coronary Artery Calcifications Echocardiogram 06/19/24: LVEF 50-55%, indeterminate diastolic parameters, RV systolic function and RV size both normal - Continuous cardiac monitoring - Vasopressors to maintain MAP >65 ~ not currently requiring - Troponin peaked at 278 - Continue aspirin  and statin  #AGMA s/t CAP with Lactic Acidosis ~ Resolved - Trend BMP and monitor renal function - Ensure adequate renal perfusion - Avoid nephrotoxins as able - ICU electrolyte replacement protocol - Pharmacy to assist with replacement - Lactic acid normalized  #Met SIRS Criteria on presentation (Pulse 125, RR 24, WBC 21.3) #Sepsis due to ... #Community Acquired Pneumonia: Strep Pneumoniae Pneumonia #Rhinovirus infection  - Trend WBC and monitor fever curve - WBC wnl and Tmax 99.1 - Blood cultures x 2, no growth to date - Continue azithromycin  and ceftriaxone   #DVT Prophylaxis - Continue Lovenox    Best Practice (right click and Reselect all SmartList Selections daily)   Diet/type: Regular DVT prophylaxis:  Lovenox   GI prophylaxis: N/A Lines: N/A Foley:  N/A Code Status:  full code Last date of multidisciplinary goals of care discussion [11/21]   Labs   CBC: Recent Labs  Lab 06/18/24 2024 06/19/24 0425 06/20/24 0422 06/21/24 0336 06/22/24 0440  WBC 21.3* 17.3* 14.9* 13.5* 9.0  NEUTROABS 14.9*  --   --   --   --   HGB 12.5 11.5* 11.3* 10.5* 11.0*  HCT 38.3 34.1* 34.0* 31.1* 34.4*  MCV 89.9 87.2 88.5 88.1 89.4  PLT 247 201 202 205 217    Basic Metabolic Panel: Recent Labs  Lab 06/18/24 2024 06/19/24 0425 06/20/24 0422 06/21/24 0336 06/22/24 0440  NA 136 138 140 142 140  K 4.6 4.4 4.2 4.1 4.4  CL 99 101 103 103  101  CO2 21* 23 26 29 29   GLUCOSE 335* 153* 135* 83 115*  BUN 9 12 17 19 22   CREATININE 0.67 0.60 0.47 0.45 0.46  CALCIUM 9.2 9.1 9.0 8.7* 9.0  MG  --   --  2.1 2.2  --   PHOS  --   --  2.0* 2.9 2.8   GFR: Estimated Creatinine Clearance: 51.7 mL/min (by C-G formula based on SCr of 0.46 mg/dL). Recent Labs  Lab 06/18/24 2256 06/19/24 0425 06/19/24 1146 06/19/24 1407 06/20/24 0422 06/21/24 0336 06/22/24 0440  WBC  --  17.3*  --   --  14.9* 13.5* 9.0  LATICACIDVEN 2.5*  --  1.6 1.0  --   --   --     Liver Function Tests: Recent Labs  Lab 06/18/24 2024 06/19/24 0425 06/20/24 0422 06/21/24 0336 06/22/24 0440  AST 38 42*  --   --   --   ALT 37 36  --   --   --   ALKPHOS 89 78  --   --   --   BILITOT 0.5 0.4  --   --   --   PROT 7.6 7.3  --   --   --   ALBUMIN 4.2 4.0 4.0 3.6 3.9   No results for input(s): LIPASE, AMYLASE in the last 168 hours. No results for input(s): AMMONIA in the last 168 hours.  ABG    Component Value Date/Time   HCO3 23.4 06/18/2024 2024   ACIDBASEDEF 4.0 (H) 06/18/2024 2024   O2SAT 100 06/18/2024 2024     Coagulation Profile: Recent Labs  Lab 06/18/24 2024  INR 1.0    Cardiac Enzymes: No results for input(s): CKTOTAL, CKMB, CKMBINDEX, TROPONINI in the last 168 hours.  HbA1C: Hgb A1c MFr Bld  Date/Time Value Ref Range Status  06/19/2024 04:25 AM 5.6 4.8 - 5.6 % Final    Comment:    (NOTE) Diagnosis of Diabetes The following HbA1c ranges recommended by the American Diabetes Association (ADA) may be used as an aid in the diagnosis of diabetes mellitus.  Hemoglobin             Suggested A1C NGSP%              Diagnosis  <5.7                   Non Diabetic  5.7-6.4                Pre-Diabetic  >6.4                   Diabetic  <7.0  Glycemic control for                       adults with diabetes.    06/24/2021 06:13 AM 5.8 (H) 4.8 - 5.6 % Final    Comment:    (NOTE)         Prediabetes:  5.7 - 6.4         Diabetes: >6.4         Glycemic control for adults with diabetes: <7.0     CBG: Recent Labs  Lab 06/19/24 2206  GLUCAP 152*    Review of Systems:   She endorses intermittent shortness of breath and wheezing, as well as anxiety. All other systems negative.    Past Medical History:  She,  has a past medical history of COPD (chronic obstructive pulmonary disease) (HCC).   Surgical History:  History reviewed. No pertinent surgical history.   Social History:   reports that she has quit smoking. Her smoking use included cigarettes. She has never used smokeless tobacco. She reports that she does not currently use alcohol. She reports that she does not use drugs.   Family History:  Her family history is not on file.   Allergies No Known Allergies   Home Medications  Prior to Admission medications   Medication Sig Start Date End Date Taking? Authorizing Provider  albuterol  (PROVENTIL ) (2.5 MG/3ML) 0.083% nebulizer solution Take 3 mLs (2.5 mg total) by nebulization every 6 (six) hours as needed for wheezing or shortness of breath. 06/28/21  Yes Wieting, Richard, MD  albuterol  (VENTOLIN  HFA) 108 (90 Base) MCG/ACT inhaler Inhale 2 puffs into the lungs every 6 (six) hours as needed for shortness of breath. 06/28/21  Yes Wieting, Richard, MD  fluticasone-salmeterol (ADVAIR) 250-50 MCG/ACT AEPB Inhale 1 puff into the lungs 2 (two) times daily. 06/08/21  Yes [provider]  Multiple Vitamin (MULTIVITAMIN WITH MINERALS) TABS tablet Take 1 tablet by mouth daily. 06/29/21  Yes Wieting, Richard, MD  simvastatin  (ZOCOR ) 40 MG tablet Take 40 mg by mouth at bedtime. 05/30/21  Yes [provider]  SPIRIVA HANDIHALER 18 MCG inhalation capsule Place 1 capsule into inhaler and inhale in the morning. 06/08/21  Yes [provider]  Tiotropium Bromide (SPIRIVA HANDIHALER) 18 MCG CAPS Place 18 mcg into inhaler and inhale. 05/28/24 08/26/24 Yes [provider]     Care time: 45 minutes      Mckinley Olheiser, PA-C Pulmonary/Critical Care PCCM Team Contact Info: [phone]

## 2024-06-22 NOTE — Plan of Care (Signed)
  Problem: Education: Goal: Knowledge of General Education information will improve Description: Including pain rating scale, medication(s)/side effects and non-pharmacologic comfort measures Outcome: Progressing   Problem: Health Behavior/Discharge Planning: Goal: Ability to manage health-related needs will improve Outcome: Progressing   Problem: Clinical Measurements: Goal: Diagnostic test results will improve Outcome: Progressing   Problem: Nutrition: Goal: Adequate nutrition will be maintained Outcome: Progressing   Problem: Coping: Goal: Level of anxiety will decrease Outcome: Progressing   Problem: Elimination: Goal: Will not experience complications related to urinary retention Outcome: Progressing   Problem: Clinical Measurements: Goal: Ability to maintain a body temperature in the normal range will improve Outcome: Progressing   Problem: Respiratory: Goal: Ability to maintain adequate ventilation will improve Outcome: Progressing Goal: Ability to maintain a clear airway will improve Outcome: Progressing

## 2024-06-22 NOTE — Assessment & Plan Note (Signed)
 As needed Xanax .  Start Lexapro 

## 2024-06-22 NOTE — Assessment & Plan Note (Signed)
 Secondary to respiratory status.

## 2024-06-22 NOTE — Progress Notes (Signed)
 Progress Note   Patient: Gina Bates FMW:[id number] DOB: 1960-09-18 DOA: 06/18/2024     4 DOS: the patient was seen and examined on 06/22/2024   Brief hospital course:  63 y.o. female with medical history significant of COPD, elevated troponin history, who presents to the ER with sudden onset of shortness of breath and hypoxia.  Patient brought in by EMS on CPAP.  Oxygen sat was 80% on room air apparently when they arrived.  Patient has received albuterol  and Solu-Medrol .  Still having significant shortness of breath.  Patient is in the tripod position right now.  Workup in the ER showed suspected pneumonia but also fluid overload.  Suspected new CHF.  Patient has been placed on BiPAP.  Additionally troponin was noted to be more than 100.  No chest pain.  No peripheral edema.  Patient has been admitted with acute on chronic hypoxic respiratory failure.   11/20.  Patient admitted with respiratory distress and placed on BiPAP.  We tried to take off BiPAP this morning but needed to go back on BiPAP secondary to shortness of breath.  Continue antibiotics for pneumonia and Solu-Medrol .  Patient initially placed on heparin  drip secondary to elevated troponin.  No complaints of chest pain. 11/21.  Patient required BiPAP and an extra dose of Solu-Medrol  last night.  Respiratory panel positive for rhinovirus.  Streptococcus pneumoniae urinary antigen positive.  Patient still feels short of breath.  In the evening, had worsening shortness of breath and was tripoding and needed to go back on BiPAP. 11/22.  Will start low-dose Lexapro .  Patient on heated high flow nasal cannula 30 L flow and 33% oxygen.  Assessment and Plan: * Acute respiratory distress Patient placed on BiPAP on presentation.  Patient still with using accessory muscles to breathe.  On heated high flow nasal cannula 30 L flow and 33% oxygen with good saturations.  Will try Roxanol for air hunger.  On Xanax  as needed.  Add Lexapro  to prevent  anxiety.  Palliative care consulted.  Acute on chronic respiratory failure with hypoxia [facility]) Patient requiring BiPAP on presentation.  This morning on heated high flow nasal cannula 33% oxygen and 30 L flow.  Using accessory muscles.  Normally wears 2 L of oxygen.  Severe sepsis (HCC) Present on admission with tachycardia, tachypnea, leukocytosis and lobar pneumonia and lactic acidosis.  Continue Rocephin  and Zithromax .  Respiratory viral panel positive for rhinovirus.  Urinary Streptococcus pneumoniae antigen positive.  Lobar pneumonia Continue Rocephin  and Zithromax .  1 bottle positive for staph hominis which is likely contamination.  Urinary Streptococcus pneumoniae antigen positive.  COPD exacerbation (HCC) Continue IV Solu-Medrol  and nebulizer treatments.  Anxiety As needed Xanax .  Start Lexapro   Sinus tachycardia Secondary to respiratory status.  Malnutrition of moderate degree Continue supplements  Pressure injury of skin From continuous BiPAP. Wound 06/20/24 1217 Pressure Injury Nose Mid Stage 2 -  Partial thickness loss of dermis presenting as a shallow open injury with a red, pink wound bed without slough. (Active)      Myocardial injury This is demand ischemia from respiratory distress pneumonia and COPD exacerbation.  Heparin  drip started on admission with elevated troponin, continue aspirin  and Zocor .  LDL 103.  Echocardiogram shows an EF of 50 to 55%.  Lactic acidosis Improved        Subjective: Patient breathing little bit better this morning than yesterday evening.  Patient willing to try low-dose Lexapro  for preventing anxiety.  Patient still with some shortness of breath and cough.  Physical Exam: Vitals:   06/22/24 0700 06/22/24 0715 06/22/24 0730 06/22/24 0753  BP: 111/74     Pulse: 95 (!) 101 88   Resp: (!) 23 20 (!) 21   Temp:      TempSrc:      SpO2: 95% 94% 95% 91%  Weight:      Height:       Physical Exam HENT:     Head: Normocephalic.      Mouth/Throat:     Pharynx: No oropharyngeal exudate.  Eyes:     General: Lids are normal.     Conjunctiva/sclera: Conjunctivae normal.  Cardiovascular:     Rate and Rhythm: Normal rate and regular rhythm.     Heart sounds: Normal heart sounds, S1 normal and S2 normal.  Pulmonary:     Effort: Accessory muscle usage present.     Breath sounds: Examination of the right-lower field reveals decreased breath sounds and rhonchi. Examination of the left-lower field reveals decreased breath sounds and rhonchi. Decreased breath sounds and rhonchi present. No wheezing or rales.  Abdominal:     Palpations: Abdomen is soft.     Tenderness: There is no abdominal tenderness.  Musculoskeletal:     Right lower leg: No swelling.     Left lower leg: No swelling.  Skin:    General: Skin is warm.     Findings: No rash.  Neurological:     Mental Status: She is alert and oriented to person, place, and time.     Data Reviewed: Creatinine 0.46, electrolytes normal range, LDL 103, white blood cell count 9.0, hemoglobin 11.0, platelet count 217  Family Communication: Son at bedside  Disposition: Status is: Inpatient Remains inpatient appropriate because: Respiratory status still tenuous.  At least patient on less oxygen and less liter flow today on heated high flow nasal cannula.  Planned Discharge Destination: Home with Home Health    Time spent: 28 minutes  Author: Charlie Patterson, MD 06/22/2024 11:47 AM  For on call review [email or web address].

## 2024-06-22 NOTE — Plan of Care (Signed)
  Problem: Education: Goal: Knowledge of General Education information will improve Description: Including pain rating scale, medication(s)/side effects and non-pharmacologic comfort measures Outcome: Progressing   Problem: Clinical Measurements: Goal: Ability to maintain clinical measurements within normal limits will improve Outcome: Progressing   Problem: Pain Managment: Goal: General experience of comfort will improve and/or be controlled Outcome: Progressing   Problem: Safety: Goal: Ability to remain free from injury will improve Outcome: Progressing   Problem: Skin Integrity: Goal: Risk for impaired skin integrity will decrease Outcome: Progressing   Problem: Activity: Goal: Risk for activity intolerance will decrease Outcome: Not Progressing   Problem: Nutrition: Goal: Adequate nutrition will be maintained Outcome: Not Progressing   Problem: Coping: Goal: Level of anxiety will decrease Outcome: Not Progressing   Problem: Respiratory: Goal: Ability to maintain adequate ventilation will improve Outcome: Not Progressing

## 2024-06-23 DIAGNOSIS — J181 Lobar pneumonia, unspecified organism: Secondary | ICD-10-CM | POA: Diagnosis not present

## 2024-06-23 DIAGNOSIS — J189 Pneumonia, unspecified organism: Secondary | ICD-10-CM

## 2024-06-23 DIAGNOSIS — Z7189 Other specified counseling: Secondary | ICD-10-CM

## 2024-06-23 DIAGNOSIS — K5909 Other constipation: Secondary | ICD-10-CM

## 2024-06-23 DIAGNOSIS — Z515 Encounter for palliative care: Secondary | ICD-10-CM

## 2024-06-23 DIAGNOSIS — J441 Chronic obstructive pulmonary disease with (acute) exacerbation: Secondary | ICD-10-CM | POA: Diagnosis not present

## 2024-06-23 DIAGNOSIS — A419 Sepsis, unspecified organism: Secondary | ICD-10-CM | POA: Diagnosis not present

## 2024-06-23 DIAGNOSIS — J9621 Acute and chronic respiratory failure with hypoxia: Secondary | ICD-10-CM | POA: Diagnosis not present

## 2024-06-23 DIAGNOSIS — R0603 Acute respiratory distress: Secondary | ICD-10-CM | POA: Diagnosis not present

## 2024-06-23 DIAGNOSIS — K5900 Constipation, unspecified: Secondary | ICD-10-CM | POA: Insufficient documentation

## 2024-06-23 LAB — CULTURE, BLOOD (ROUTINE X 2): Culture: NO GROWTH

## 2024-06-23 MED ORDER — POLYETHYLENE GLYCOL 3350 17 G PO PACK
17.0000 g | PACK | Freq: Every day | ORAL | Status: DC
  Administered 2024-06-23: 17 g via ORAL
  Filled 2024-06-23 (×4): qty 1

## 2024-06-23 MED ORDER — LACTULOSE 10 GM/15ML PO SOLN
30.0000 g | Freq: Every day | ORAL | Status: DC | PRN
Start: 2024-06-23 — End: 2024-06-26

## 2024-06-23 MED ORDER — IPRATROPIUM-ALBUTEROL 0.5-2.5 (3) MG/3ML IN SOLN
3.0000 mL | Freq: Two times a day (BID) | RESPIRATORY_TRACT | Status: DC
  Administered 2024-06-23 – 2024-06-25 (×4): 3 mL via RESPIRATORY_TRACT
  Filled 2024-06-23 (×4): qty 3

## 2024-06-23 MED ORDER — SODIUM CHLORIDE 0.9 % IV SOLN
2.0000 g | INTRAVENOUS | Status: AC
  Administered 2024-06-23 – 2024-06-24 (×2): 2 g via INTRAVENOUS
  Filled 2024-06-23 (×2): qty 20

## 2024-06-23 NOTE — Evaluation (Signed)
 Occupational Therapy Evaluation Patient Details Name: Gina Bates MRN: [id number] DOB: [birth year] Today's Date: 06/23/2024   History of Present Illness   Gina Bates is a 63 y.o. female with medical history significant of COPD, elevated troponin history, who presents to the ER with sudden onset of shortness of breath and hypoxia.  Patient admitted on 06/18/24 with acute on chronic hypoxic respiratory failure.   Clinical Impressions Gina Bates was seen for OT evaluation this date. Prior to hospital admission, pt was IND. Pt lives alone. Pt currently requires CGA for sit<>stand no AD, HHA + CGA side steps along EOB. SpO2 90% on 35L HFNC with activity. MOD I bed mobility using rails. SUPERVISION don gown sitting EOB. Pt would benefit from skilled OT to address noted impairments and functional limitations (see below for any additional details). Upon hospital discharge, recommend no OT follow up, may benefit from cardiopulmonary rehab as appropriate.     If plan is discharge home, recommend the following:   A little help with walking and/or transfers;A little help with bathing/dressing/bathroom     Functional Status Assessment   Patient has had a recent decline in their functional status and demonstrates the ability to make significant improvements in function in a reasonable and predictable amount of time.     Equipment Recommendations   BSC/3in1     Recommendations for Other Services         Precautions/Restrictions   Precautions Precautions: Fall Recall of Precautions/Restrictions: Intact Restrictions Weight Bearing Restrictions Per Provider Order: No     Mobility Bed Mobility Overal bed mobility: Modified Independent             General bed mobility comments: rail use    Transfers Overall transfer level: Needs assistance Equipment used: None Transfers: Sit to/from Stand Sit to Stand: Contact guard assist                  Balance Overall  balance assessment: Needs assistance Sitting-balance support: No upper extremity supported, Feet supported Sitting balance-Leahy Scale: Good     Standing balance support: Single extremity supported, During functional activity Standing balance-Leahy Scale: Fair                             ADL either performed or assessed with clinical judgement   ADL Overall ADL's : Needs assistance/impaired                                       General ADL Comments: CGA for simulated BSC t/f. SUPERVISION don gown sitting EOB.       Pertinent Vitals/Pain Pain Assessment Pain Assessment: No/denies pain     Extremity/Trunk Assessment Upper Extremity Assessment Upper Extremity Assessment: Overall WFL for tasks assessed   Lower Extremity Assessment Lower Extremity Assessment: Generalized weakness       Communication Communication Communication: No apparent difficulties   Cognition Arousal: Alert Behavior During Therapy: WFL for tasks assessed/performed, Anxious Cognition: No apparent impairments             OT - Cognition Comments: pt reports anxiousness with mobility                 Following commands: Intact       Cueing  General Comments      SpO2 90% on 35L HFNC with activity   Exercises     Shoulder  Instructions      Home Living Family/patient expects to be discharged to:: Private residence Living Arrangements: Alone Available Help at Discharge: Family Type of Home: House Home Access: Stairs to enter Secretary/administrator of Steps: 3 Entrance Stairs-Rails: Can reach both Home Layout: Two level     Bathroom Shower/Tub: Producer, Television/film/video: Standard     Home Equipment: None   Additional Comments: Pt reports she can live on first floor while recovering      Prior Functioning/Environment Prior Level of Function : Independent/Modified Independent                    OT Problem List: Decreased  activity tolerance;Impaired balance (sitting and/or standing)   OT Treatment/Interventions: Self-care/ADL training;Therapeutic exercise;Energy conservation;DME and/or AE instruction;Therapeutic activities;Patient/family education;Balance training      OT Goals(Current goals can be found in the care plan section)   Acute Rehab OT Goals Patient Stated Goal: to go home OT Goal Formulation: With patient Time For Goal Achievement: 07/07/24 Potential to Achieve Goals: Good ADL Goals Pt Will Perform Grooming: Independently;standing Pt Will Perform Lower Body Dressing: sit to/from stand Pt Will Transfer to Toilet: Independently;ambulating;regular height toilet   OT Frequency:  Min 2X/week    Co-evaluation PT/OT/SLP Co-Evaluation/Treatment: Yes Reason for Co-Treatment: To address functional/ADL transfers PT goals addressed during session: Mobility/safety with mobility OT goals addressed during session: ADL's and self-care      AM-PAC OT 6 Clicks Daily Activity     Outcome Measure Help from another person eating meals?: None Help from another person taking care of personal grooming?: A Little Help from another person toileting, which includes using toliet, bedpan, or urinal?: A Little Help from another person bathing (including washing, rinsing, drying)?: A Little Help from another person to put on and taking off regular upper body clothing?: A Little Help from another person to put on and taking off regular lower body clothing?: A Little 6 Click Score: 19   End of Session Nurse Communication: Mobility status  Activity Tolerance: Patient tolerated treatment well Patient left: in bed;with call bell/phone within reach;with family/visitor present  OT Visit Diagnosis: Other abnormalities of gait and mobility (R26.89);Muscle weakness (generalized) (M62.81)                Time: 9073-9050 OT Time Calculation (min): 23 min Charges:  OT General Charges $OT Visit: 1 Visit OT  Evaluation $OT Eval Low Complexity: 1 Low OT Treatments $Self Care/Home Management : 8-22 mins  Elston Slot, M.S. OTR/L  06/23/24, 10:18 AM  ascom [phone]

## 2024-06-23 NOTE — TOC Initial Note (Signed)
 Transition of Care [facility]) - Initial/Assessment Note    Patient Details  Name: Gina Bates MRN: [id number] Date of Birth: [birth year]  Transition [facility]) CM/SW Contact:    Corrie JINNY Ruts, LCSW Phone Number: 06/23/2024, 2:48 PM  Clinical Narrative:                 Chart reviewed. The patient was admitted for acute respiratory distress. I was able to speak with the patient and her son was present. I introduced myself, my role, and reason for consult. The patient reports that she was doing ok.   The patient reports that she lives in the home with her husband. The patient reports that she was able to complete daily living task independently. The patient reports that her husband drives her to medical appointments.   The patient reports that her family will assist during D/C. The patient reports that she uses psychologist, forensic. The patient reports that she has never had HH or been admitted into a SNF in the past. The patient reports that she has no equipment in the home.   I informed the patient about the recommendation of HH PT/OT. The patient was accepting. I provided the patient with a list of HH agencies. The patient consented to SW sending out referral in the [place].   I have sent the referral. TOC will follow the patient until D/C.     Barriers to Discharge: Continued Medical Work up   Patient Goals and CMS Choice            Expected Discharge Plan and Services                                              Prior Living Arrangements/Services   Lives with:: Spouse Patient language and need for interpreter reviewed:: Yes        Need for Family Participation in Patient Care: Yes (Comment)     Criminal Activity/Legal Involvement Pertinent to Current Situation/Hospitalization: No - Comment as needed  Activities of Daily Living      Permission Sought/Granted                  Emotional Assessment Appearance:: Appears stated age Attitude/Demeanor/Rapport:  Gracious Affect (typically observed): Calm Orientation: : Oriented to Place, Oriented to Self, Oriented to Situation, Oriented to  Time Alcohol / Substance Use: Not Applicable Psych Involvement: No (comment)  Admission diagnosis:  COPD exacerbation (HCC) [J44.1] Acute hypoxemic respiratory failure (HCC) [J96.01] Pneumonia of left lower lobe due to infectious organism [J18.9] Sepsis, due to unspecified organism, unspecified whether acute organ dysfunction present [facility]) [A41.9] Patient Active Problem List   Diagnosis Date Noted   Constipation 06/23/2024   Anxiety 06/22/2024   Malnutrition of moderate degree 06/21/2024   Sinus tachycardia 06/21/2024   Rhinovirus infection 06/20/2024   Pressure injury of skin 06/20/2024   Acute respiratory distress 06/19/2024   COPD exacerbation (HCC) 06/19/2024   Lobar pneumonia 06/19/2024   Lactic acidosis 06/19/2024   Myocardial injury 06/19/2024   Severe sepsis (HCC) 06/19/2024   Pneumonia of left lower lobe due to infectious organism 06/19/2024   Coronary artery disease of native artery of native heart with stable angina pectoris 06/19/2024   Elevated d-dimer    Impaired fasting glucose    Acute on chronic respiratory failure with hypoxia (HCC) 06/23/2021   PCP:  Kerry Rollene SAUNDERS,  MD Pharmacy:   [facility] [address] [place] - [address] [place] [address] Phone: [phone] Fax: [phone]     Social Drivers of Health (SDOH) Social History: SDOH Screenings   Food Insecurity: No Food Insecurity (06/20/2024)  Housing: Low Risk  (06/20/2024)  Transportation Needs: No Transportation Needs (06/20/2024)  Utilities: Not At Risk (06/20/2024)  Financial Resource Strain: Low Risk (05/28/2024)   Received from [facility]  Tobacco Use: Medium Risk (06/18/2024)   SDOH Interventions:     Readmission Risk Interventions     No data to display

## 2024-06-23 NOTE — Plan of Care (Signed)
  Problem: Education: Goal: Knowledge of General Education information will improve Description: Including pain rating scale, medication(s)/side effects and non-pharmacologic comfort measures Outcome: Progressing   Problem: Clinical Measurements: Goal: Ability to maintain clinical measurements within normal limits will improve Outcome: Progressing Goal: Cardiovascular complication will be avoided Outcome: Progressing   Problem: Nutrition: Goal: Adequate nutrition will be maintained Outcome: Progressing   Problem: Coping: Goal: Level of anxiety will decrease Outcome: Progressing   Problem: Pain Managment: Goal: General experience of comfort will improve and/or be controlled Outcome: Progressing   Problem: Safety: Goal: Ability to remain free from injury will improve Outcome: Progressing   Problem: Clinical Measurements: Goal: Respiratory complications will improve Outcome: Not Progressing

## 2024-06-23 NOTE — Consult Note (Addendum)
 Consultation Note Date: 06/23/2024 at 1000  Patient Name: Gina Bates  DOB: 1961-02-28  MRN: [id number]  Age / Sex: 63 y.o., female  PCP: Kerry Rollene SAUNDERS, MD Referring Physician: Josette Ade, MD  HPI/Patient Profile: 63 y.o. female  with past medical history of COPD admitted on 06/18/2024 with sudden onset of SOB an hypoxia.  On EMS arrival, patient was tripod breathing and short of breath, found to be hypoxic with oxygenation saturation of 80% on room air.  Patient given albuterol , Solu-Medrol , and placed on CPAP.  Upon arrival to ED, patient continued to exhibit significant shortness of breath and was placed on BiPAP.  Respiratory viral panel revealed rhinovirus.  Strep pneumoniae urine antigen was positive. Patient is being treated for acute on chronic hypoxic respiratory failure due to AECOPD, community-acquired pneumonia, and elevated troponins due to demand ischemia and questionable CHF (now resolved).  Cardiology was consulted but patient refused CTA of the chest to rule out PE.  Patient was very anxious and apprehensive about being weaned off of BiPAP.  As needed Xanax  and morphine  given prior to transition off of BiPAP.  Patient has now been weaned off of BiPAP and is now on nasal cannula.  PMT was consulted to support patient and family with goals of care discussions.  Clinical Assessment and Goals of Care: Extensive chart review completed prior to meeting patient including labs, vital signs, imaging, progress notes, orders, and available advanced directive documents from current and previous encounters. I then met with patient and her son at bedside to discuss diagnosis prognosis, GOC, EOL wishes, disposition and options.  I introduced Palliative Medicine as specialized medical care for people living with serious illness. It focuses on providing relief from the symptoms and stress of a  serious illness. The goal is to improve quality of life for both the patient and the family.  We discussed a brief life review of the patient.  Patient is a married mother of 3.  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  As far as functional and nutritional status patient and son endorse patient was having no difficulty with performing ADLs or with p.o. intake PTA.  They both have a clear understanding of COPD as a chronic, progressive, and irreversible disease that is often exacerbated by acute illnesses and hospitalizations.  They share this exacerbation feels like it came out of nowhere.  Symptoms assessed.  Patient shares that she is sleepy but is only tired from not being able to get solid rest with all the noise of the ICU.  She shares she feels well and that her breathing has significantly improved since admission.  Currently denies chest pain, headache, shortness of breath, and other acute physical issues at this time.  Discussed bowel regimen.  Patient Dors that she has not had a bowel movement since last Wednesday.  However, she shares she is not constipated, does not feel bloated, and does not wish to take anything at this time for constipation.  She shares she normally has a bowel movement approximately once a week and that this is normal for her.  She discussed significance of increased chance for constipation given use of narcotics and hospitalization.  She endorses understanding.  However, denies issue with feeling constipated and not having a bowel movement at this time.  She denies physical complaints.  However, she does endorse anxiety.  She request that she be forewarned and given some knowledge of what is going to happen to her prior to moving wanting to work with her.  For example, she shares that physical therapy told her they were going to come in and work with her.  She was able to prepare herself and take it at her own  pace-resulting in what she believes was a very effective therapy session.   I shared that I will continue to encourage the medical team to communicate plans and set expectations prior to asking patient to participate with various therapies.  We discussed patient's current medication regimen.  I highlighted patient continues to have as needed morphine  and Xanax .  Additionally, discussed use of Lexapro  to aid in management of anxiety.  However, this medication will take at least 6 weeks to reach steady state and is by no means a rescue antianxiety lytic.  Patient son endorsed understanding.   I attempted to elicit values and goals of care important to the patient.  She endorses that she is hopeful this is just a one-off and that she will be able to return home soon.  Advance directives, concepts specific to code status, artificial feeding and hydration, and rehospitalization were considered and discussed.  In the event that patient is unable to speak for herself, her husband and her next of kin decision maker.   Patient endorses she has never completed an advance directive or living will.  Additionally, she shares she has not discussed CODE STATUS or boundaries of medical treatment with her husband.    Discussed that patient is currently a full code.  Reviewed that use of ventilatory support as well as ACLS protocol are not part of a full CODE STATUS.  She endorsed understanding but deferred further discussion of CODE STATUS to a later time when she is not as sleepy.  Full code remains.  Discussed with patient/family the importance of continued conversation with family and the medical providers regarding overall plan of care and treatment options, ensuring decisions are within the context of the patient's values and GOCs.    Primary Decision Maker PATIENT  Physical Exam Vitals reviewed.  Constitutional:      General: She is not in acute distress.    Appearance: She is not ill-appearing.   HENT:     Head: Normocephalic.  Cardiovascular:     Rate and Rhythm: Normal rate.  Pulmonary:     Breath sounds: Examination of the right-lower field reveals decreased breath sounds. Examination of the left-lower field reveals decreased breath sounds. Decreased breath sounds present.  Musculoskeletal:     Comments: Generalized weakness  Neurological:     Mental Status: She is alert and oriented to person, place, and time.  Psychiatric:        Mood and Affect: Mood normal.        Behavior: Behavior normal. Behavior is not agitated.     Palliative Assessment/Data: 60-70%     Thank you for this consult. Palliative medicine will continue to follow and assist holistically.   75 minute visit includes: Detailed review of medical records (labs, imaging,  vital signs), medically appropriate exam (mental status, respiratory, cardiac, skin), discussed with treatment team, counseling and educating patient, family and staff, documenting clinical information, medication management and coordination of care.  Signed by: Lamarr Gunner, DNP, FNP-BC Palliative Medicine   Please contact Palliative Medicine Team providers via [facility] for questions and concerns.

## 2024-06-23 NOTE — Progress Notes (Signed)
 Patient transferred to room 232 by bed with this RN and husband at side. Patient alert with no acute distress noted. Patient having some anxiety about moving to a new room. Prentice, RN at bedside to receive patient.

## 2024-06-23 NOTE — Assessment & Plan Note (Signed)
 Start MiraLAX .  As needed lactulose .

## 2024-06-23 NOTE — Progress Notes (Signed)
 Progress Note   Patient: Gina Bates FMW:[id number] DOB: 21-Feb-1961 DOA: 06/18/2024     5 DOS: the patient was seen and examined on 06/23/2024   Brief hospital course:  63 y.o. female with medical history significant of COPD, elevated troponin history, who presents to the ER with sudden onset of shortness of breath and hypoxia.  Patient brought in by EMS on CPAP.  Oxygen sat was 80% on room air apparently when they arrived.  Patient has received albuterol  and Solu-Medrol .  Still having significant shortness of breath.  Patient is in the tripod position right now.  Workup in the ER showed suspected pneumonia but also fluid overload.  Suspected new CHF.  Patient has been placed on BiPAP.  Additionally troponin was noted to be more than 100.  No chest pain.  No peripheral edema.  Patient has been admitted with acute on chronic hypoxic respiratory failure.   11/20.  Patient admitted with respiratory distress and placed on BiPAP.  We tried to take off BiPAP this morning but needed to go back on BiPAP secondary to shortness of breath.  Continue antibiotics for pneumonia and Solu-Medrol .  Patient initially placed on heparin  drip secondary to elevated troponin.  No complaints of chest pain. 11/21.  Patient required BiPAP and an extra dose of Solu-Medrol  last night.  Respiratory panel positive for rhinovirus.  Streptococcus pneumoniae urinary antigen positive.  Patient still feels short of breath.   11/22 in the evening, had worsening shortness of breath and was tripoding and needed to go back on BiPAP. 11/23.  Will start low-dose Lexapro .  Patient on heated high flow nasal cannula 30 L flow and 33% oxygen. 11/24.  Patient feeling little bit better and breathing little bit easier.  Patient switched from heated high flow nasal cannula 39% FiO2 35 L flow over to bubble high flow nasal cannula 6 L.  Transfer out of the ICU.  Assessment and Plan: * Acute respiratory distress Patient placed on BiPAP on  presentation.  Patient was either on heated high flow nasal cannula or BiPAP up until this point.  Switched over to bubble high flow nasal cannula 6 L today.  Oxygen with good saturations.  Will try Roxanol for air hunger.  On Xanax  as needed.  Continue Lexapro  to prevent anxiety.  Palliative care consulted.  Acute on chronic respiratory failure with hypoxia [facility]) Patient requiring BiPAP on presentation.  This morning on heated high flow nasal cannula 39% oxygen and 35 L flow.  Patient switch over to 6 L bubble high flow nasal cannula today.  Normally wears 2 L of oxygen.  Severe sepsis (HCC) Present on admission with tachycardia, tachypnea, leukocytosis and lobar pneumonia and lactic acidosis.  Continue Rocephin  and Zithromax  completed.  Respiratory viral panel positive for rhinovirus.  Urinary Streptococcus pneumoniae antigen positive.  Lobar pneumonia Continue Rocephin  and Zithromax  completed.  1 bottle positive for staph hominis which is likely contamination.  Urinary Streptococcus pneumoniae antigen positive.  COPD exacerbation (HCC) Continue IV Solu-Medrol  and nebulizer treatments.  Constipation Start MiraLAX .  As needed lactulose .  Anxiety As needed Xanax .  Continue Lexapro   Sinus tachycardia Secondary to respiratory status.  Malnutrition of moderate degree Continue supplements  Pressure injury of skin From continuous BiPAP. Wound 06/20/24 1217 Pressure Injury Nose Mid Stage 2 -  Partial thickness loss of dermis presenting as a shallow open injury with a red, pink wound bed without slough. (Active)      Myocardial injury This is demand ischemia from respiratory distress pneumonia  and COPD exacerbation.  Heparin  drip started on admission with elevated troponin, continue aspirin  and Zocor .  LDL 103.  Echocardiogram shows an EF of 50 to 55%.  Lactic acidosis Improved        Subjective: Patient feeling little bit better.  Breathing a little bit easier.  Admitted with  respiratory distress, pneumonia and COPD exacerbation.  Physical Exam: Vitals:   06/23/24 1000 06/23/24 1100 06/23/24 1102 06/23/24 1200  BP: 112/64 119/81 119/81 99/61  Pulse: 79 86 88 68  Resp: 13   16  Temp:    98 F (36.7 C)  TempSrc:    Oral  SpO2: 96% 98% 98% 100%  Weight:      Height:       Physical Exam HENT:     Head: Normocephalic.     Mouth/Throat:     Pharynx: No oropharyngeal exudate.  Eyes:     General: Lids are normal.     Conjunctiva/sclera: Conjunctivae normal.  Cardiovascular:     Rate and Rhythm: Normal rate and regular rhythm.     Heart sounds: Normal heart sounds, S1 normal and S2 normal.  Pulmonary:     Effort: No accessory muscle usage.     Breath sounds: Examination of the right-lower field reveals decreased breath sounds and rhonchi. Examination of the left-lower field reveals decreased breath sounds and rhonchi. Decreased breath sounds and rhonchi present. No wheezing or rales.  Abdominal:     Palpations: Abdomen is soft.     Tenderness: There is no abdominal tenderness.  Musculoskeletal:     Right lower leg: No swelling.     Left lower leg: No swelling.  Skin:    General: Skin is warm.     Findings: No rash.  Neurological:     Mental Status: She is alert and oriented to person, place, and time.     Data Reviewed: Last creatinine 0.46 last hemoglobin 11.0  Family Communication: Brother at bedside  Disposition: Status is: Inpatient Remains inpatient appropriate because: Converted from heated high flow nasal cannula over to bubble high flow nasal cannula 6 L.  Transfer out of ICU.  Planned Discharge Destination: Home with Home Health    Time spent: 28 minutes  Author: Charlie Patterson, MD 06/23/2024 2:22 PM  For on call review [email or web address].

## 2024-06-23 NOTE — Progress Notes (Signed)
 Physical Therapy Treatment Patient Details Name: Gina Bates MRN: [id number] DOB: 1961/03/31 Today's Date: 06/23/2024   History of Present Illness Gina Bates is a 63 y.o. female with medical history significant of COPD, elevated troponin history, who presents to the ER with sudden onset of shortness of breath and hypoxia.  Patient admitted on 06/18/24 with acute on chronic hypoxic respiratory failure.    PT Comments  Patient is agreeable to PT session. She was able to stand twice with CGA. Standing tolerance is under a minute. Sp02 90% on HHFNC. Reinforcement of breathing techniques, energy conservation. Recommend to continue PT to maximize independence and facilitate return to prior level of function.    If plan is discharge home, recommend the following: A little help with walking and/or transfers   Can travel by private vehicle        Equipment Recommendations  Rolling walker (2 wheels);BSC/3in1    Recommendations for Other Services       Precautions / Restrictions Precautions Precautions: Fall Recall of Precautions/Restrictions: Intact Restrictions Weight Bearing Restrictions Per Provider Order: No     Mobility  Bed Mobility Overal bed mobility: Modified Independent             General bed mobility comments: increased time, rest breaks required with return to bed due to SOB with activity    Transfers Overall transfer level: Needs assistance Equipment used: None Transfers: Sit to/from Stand Sit to Stand: Contact guard assist           General transfer comment: 2 standing bouts performed with cues for initiation    Ambulation/Gait             Pre-gait activities: standing tolerance less than a minute, side steps performed with Min A for safety. activity tolerance limited by fatigue with activity.     Stairs             Wheelchair Mobility     Tilt Bed    Modified Rankin (Stroke Patients Only)       Balance Overall balance  assessment: Needs assistance Sitting-balance support: No upper extremity supported, Feet supported Sitting balance-Leahy Scale: Good Sitting balance - Comments: occasional tripod position.   Standing balance support: Single extremity supported, During functional activity Standing balance-Leahy Scale: Fair                              Hotel Manager: No apparent difficulties  Cognition Arousal: Alert Behavior During Therapy: WFL for tasks assessed/performed, Anxious   PT - Cognitive impairments: No apparent impairments                         Following commands: Intact      Cueing    Exercises      General Comments General comments (skin integrity, edema, etc.): Sp02 90% on 35L. reinforced breathing techniques      Pertinent Vitals/Pain Pain Assessment Pain Assessment: No/denies pain    Home Living Family/patient expects to be discharged to:: Private residence Living Arrangements: Alone Available Help at Discharge: Family Type of Home: House Home Access: Stairs to enter Entrance Stairs-Rails: Can reach both Entrance Stairs-Number of Steps: 3   Home Layout: Two level Home Equipment: None Additional Comments: Pt reports she can live on first floor while recovering    Prior Function            PT Goals (current goals can now be found  in the care plan section) Acute Rehab PT Goals PT Goal Formulation: With patient/family Time For Goal Achievement: 07/05/24 Potential to Achieve Goals: Good Progress towards PT goals: Progressing toward goals    Frequency    Min 2X/week      PT Plan      Co-evaluation   Reason for Co-Treatment: To address functional/ADL transfers (low endurance) PT goals addressed during session: Mobility/safety with mobility OT goals addressed during session: ADL's and self-care      AM-PAC PT 6 Clicks Mobility   Outcome Measure  Help needed turning from your back to your side  while in a flat bed without using bedrails?: None Help needed moving from lying on your back to sitting on the side of a flat bed without using bedrails?: None Help needed moving to and from a bed to a chair (including a wheelchair)?: A Little Help needed standing up from a chair using your arms (e.g., wheelchair or bedside chair)?: A Little Help needed to walk in hospital room?: A Little Help needed climbing 3-5 steps with a railing? : A Lot 6 Click Score: 19    End of Session Equipment Utilized During Treatment: Oxygen Activity Tolerance: Patient limited by fatigue Patient left: in bed;with call bell/phone within reach;with chair alarm set;with family/visitor present Nurse Communication: Mobility status PT Visit Diagnosis: Muscle weakness (generalized) (M62.81);Other abnormalities of gait and mobility (R26.89);Unsteadiness on feet (R26.81);Difficulty in walking, not elsewhere classified (R26.2)     Time: 9073-9050 PT Time Calculation (min) (ACUTE ONLY): 23 min  Charges:    $Therapeutic Activity: 8-22 mins PT General Charges $$ ACUTE PT VISIT: 1 Visit                     Randine Essex, PT, MPT    Randine LULLA Essex 06/23/2024, 11:48 AM

## 2024-06-23 NOTE — Progress Notes (Signed)
 Notified Dr. Josette that patient has not had a BM since 11/18. MD placed order for miralax .

## 2024-06-24 DIAGNOSIS — F419 Anxiety disorder, unspecified: Secondary | ICD-10-CM | POA: Diagnosis not present

## 2024-06-24 DIAGNOSIS — J189 Pneumonia, unspecified organism: Secondary | ICD-10-CM | POA: Diagnosis not present

## 2024-06-24 DIAGNOSIS — J181 Lobar pneumonia, unspecified organism: Secondary | ICD-10-CM | POA: Diagnosis not present

## 2024-06-24 DIAGNOSIS — J441 Chronic obstructive pulmonary disease with (acute) exacerbation: Secondary | ICD-10-CM | POA: Diagnosis not present

## 2024-06-24 DIAGNOSIS — Z515 Encounter for palliative care: Secondary | ICD-10-CM | POA: Diagnosis not present

## 2024-06-24 DIAGNOSIS — A419 Sepsis, unspecified organism: Secondary | ICD-10-CM | POA: Diagnosis not present

## 2024-06-24 DIAGNOSIS — R0603 Acute respiratory distress: Secondary | ICD-10-CM | POA: Diagnosis not present

## 2024-06-24 MED ORDER — BACITRACIN-NEOMYCIN-POLYMYXIN OINTMENT TUBE
TOPICAL_OINTMENT | Freq: Two times a day (BID) | CUTANEOUS | Status: DC
  Filled 2024-06-24 (×2): qty 14.17

## 2024-06-24 MED ORDER — SALINE SPRAY 0.65 % NA SOLN
1.0000 | NASAL | Status: DC | PRN
  Filled 2024-06-24: qty 44

## 2024-06-24 MED ORDER — OXYMETAZOLINE HCL 0.05 % NA SOLN
1.0000 | Freq: Two times a day (BID) | NASAL | Status: DC
Start: 2024-06-25 — End: 2024-06-24
  Filled 2024-06-24: qty 15

## 2024-06-24 NOTE — Progress Notes (Signed)
 Mobility Specialist - Progress Note  Pre-mobility: HR-70,  SpO2-97%  During mobility: HR-90,SpO2-95%  Post-mobility: HR-88, , SPO2-97%   06/24/24 0900  Mobility  Activity Ambulated with assistance;Stood at bedside;Pivoted/transferred from chair to bed  Level of Assistance Independent after set-up  Assistive Device None  Distance Ambulated (ft) 8 ft  Range of Motion/Exercises All extremities;Active  Activity Response Tolerated well  Mobility visit 1 Mobility  Mobility Specialist Start Time (ACUTE ONLY) 0902  Mobility Specialist Stop Time (ACUTE ONLY) 0912  Mobility Specialist Time Calculation (min) (ACUTE ONLY) 10 min    Pt was in the recliner on O2 @ 3.5L with guest in the room upon entry. Pt agreed to mobility. O2 Vitals were taken throughout activity as a precaution. Pt was able to STS multiple times with no AD. Pt ambulated well . Pt did use recovery breaks as a precaution to check vitals. After activity pt is able to return to the bed with needs in reach and bed alarm on.  Clem Rodes Mobility Specialist 06/24/24, 9:25 AM

## 2024-06-24 NOTE — TOC Progression Note (Signed)
 Transition of Care [facility]) - Progression Note    Patient Details  Name: Stefany Starace MRN: [id number] Date of Birth: 07-Apr-1961  [facility] CM/SW Contact  Alfonso Rummer, LCSW Phone Number: 06/24/2024, 3:40 PM  Clinical Narrative:     KEN DELENA Rummer met with pt in room 232 regarding home health recommendations. Ms. Beske was alert and oriented. Ms. Busk is accepting of home health recommendation and advises [facility].     Barriers to Discharge: Continued Medical Work up               Expected Discharge Plan and Services                                               Social Drivers of Health (SDOH) Interventions SDOH Screenings   Food Insecurity: No Food Insecurity (06/20/2024)  Housing: Low Risk  (06/20/2024)  Transportation Needs: No Transportation Needs (06/20/2024)  Utilities: Not At Risk (06/20/2024)  Financial Resource Strain: Low Risk (05/28/2024)   Received from [facility]  Tobacco Use: Medium Risk (06/18/2024)    Readmission Risk Interventions     No data to display

## 2024-06-24 NOTE — Progress Notes (Signed)
 Progress Note   Patient: Gina Bates FMW:[id number] DOB: 03/04/1961 DOA: 06/18/2024     6 DOS: the patient was seen and examined on 06/24/2024   Brief hospital course:  63 y.o. female with medical history significant of COPD, elevated troponin history, who presents to the ER with sudden onset of shortness of breath and hypoxia.  Patient brought in by EMS on CPAP.  Oxygen sat was 80% on room air apparently when they arrived.  Patient has received albuterol  and Solu-Medrol .  Still having significant shortness of breath.  Patient is in the tripod position right now.  Workup in the ER showed suspected pneumonia but also fluid overload.  Suspected new CHF.  Patient has been placed on BiPAP.  Additionally troponin was noted to be more than 100.  No chest pain.  No peripheral edema.  Patient has been admitted with acute on chronic hypoxic respiratory failure.   11/20.  Patient admitted with respiratory distress and placed on BiPAP.  We tried to take off BiPAP this morning but needed to go back on BiPAP secondary to shortness of breath.  Continue antibiotics for pneumonia and Solu-Medrol .  Patient initially placed on heparin  drip secondary to elevated troponin.  No complaints of chest pain. 11/21.  Patient required BiPAP and an extra dose of Solu-Medrol  last night.  Respiratory panel positive for rhinovirus.  Streptococcus pneumoniae urinary antigen positive.  Patient still feels short of breath.   11/22 in the evening, had worsening shortness of breath and was tripoding and needed to go back on BiPAP. 11/23.  Will start low-dose Lexapro .  Patient on heated high flow nasal cannula 30 L flow and 33% oxygen. 11/24.  Patient feeling little bit better and breathing little bit easier.  Patient switched from heated high flow nasal cannula 39% FiO2 35 L flow over to bubble high flow nasal cannula 6 L.  Transfer out of the ICU. 11/25.  Doing better today but feeling weak.  Down to 3.5 L nasal cannula.  Assessment  and Plan: * Acute respiratory distress Patient placed on BiPAP on presentation.  Patient was either on heated high flow nasal cannula or BiPAP up until this point.  Switched over to bubble high flow nasal cannula 6 L on 11/20 4 in the afternoon.  Previously prescribed Roxanol for air hunger.  On Xanax  as needed.  Continue Lexapro  to prevent anxiety.  Appreciate palliative care consultation.  Patient now down to 3.5 L  Acute on chronic respiratory failure with hypoxia [facility]) Patient requiring BiPAP on presentation.  This morning on heated high flow nasal cannula 39% oxygen and 35 L flow.  Patient switch over to 6 L bubble high flow nasal cannula on 11/24.  Today down to 3.5 L.  Normally wears 2 L of oxygen.  Severe sepsis (HCC) Present on admission with tachycardia, tachypnea, leukocytosis and lobar pneumonia and lactic acidosis.  Continue Rocephin  and Zithromax  completed.  Respiratory viral panel positive for rhinovirus.  Urinary Streptococcus pneumoniae antigen positive.  Lobar pneumonia Continue Rocephin  and Zithromax  completed.  1 bottle positive for staph hominis which is likely contamination.  Urinary Streptococcus pneumoniae antigen positive.  COPD exacerbation (HCC) Continue IV Solu-Medrol  and nebulizer treatments.  Will need a tapering dose as outpatient.  Constipation Continue MiraLAX .  As needed lactulose .  Anxiety As needed Xanax .  Continue Lexapro   Sinus tachycardia Secondary to respiratory status.  Malnutrition of moderate degree Continue supplements  Pressure injury of skin From continuous BiPAP while here. Wound 06/20/24 1217 Pressure Injury Nose  Mid Stage 2 -  Partial thickness loss of dermis presenting as a shallow open injury with a red, pink wound bed without slough. (Active)      Myocardial injury This is demand ischemia from respiratory distress pneumonia and COPD exacerbation.  Heparin  drip started on admission with elevated troponin, continue aspirin  and  Zocor .  LDL 103.  Echocardiogram shows an EF of 50 to 55%.  Lactic acidosis Improved        Subjective: Patient feels like she is breathing better.  Down to 3.5 L.  Feeling weak.  Initially admitted with respiratory distress requiring continuous BiPAP.  Physical Exam: Vitals:   06/24/24 0309 06/24/24 0750 06/24/24 0800 06/24/24 1200  BP:   (!) 103/56 (!) 99/58  Pulse:   70 76  Resp:    15  Temp:   98 F (36.7 C) 97.9 F (36.6 C)  TempSrc:   Oral Oral  SpO2:  100% 98% 96%  Weight: 44.1 kg     Height:       Physical Exam HENT:     Head: Normocephalic.     Mouth/Throat:     Pharynx: No oropharyngeal exudate.  Eyes:     General: Lids are normal.     Conjunctiva/sclera: Conjunctivae normal.  Cardiovascular:     Rate and Rhythm: Normal rate and regular rhythm.     Heart sounds: Normal heart sounds, S1 normal and S2 normal.  Pulmonary:     Effort: No accessory muscle usage.     Breath sounds: Examination of the right-lower field reveals decreased breath sounds. Examination of the left-lower field reveals decreased breath sounds. Decreased breath sounds present. No wheezing, rhonchi or rales.  Abdominal:     Palpations: Abdomen is soft.     Tenderness: There is no abdominal tenderness.  Musculoskeletal:     Right lower leg: No swelling.     Left lower leg: No swelling.  Skin:    General: Skin is warm.     Findings: No rash.  Neurological:     Mental Status: She is alert and oriented to person, place, and time.     Data Reviewed: No new data  Family Communication: Granddaughter at bedside  Disposition: Status is: Inpatient Remains inpatient appropriate because: Patient finally started moving better air and down to 3.5 L nasal cannula close to her baseline.  Continue working on her strength.  May need another day or so here in the hospital depending on progress with her strength  Planned Discharge Destination: Home with Home Health    Time spent: 28  minutes  Author: Charlie Patterson, MD 06/24/2024 1:31 PM  For on call review [email or web address].

## 2024-06-24 NOTE — Plan of Care (Signed)
  Problem: Education: Goal: Knowledge of General Education information will improve Description: Including pain rating scale, medication(s)/side effects and non-pharmacologic comfort measures Outcome: Progressing   Problem: Clinical Measurements: Goal: Respiratory complications will improve Outcome: Progressing   Problem: Clinical Measurements: Goal: Cardiovascular complication will be avoided Outcome: Progressing   Problem: Activity: Goal: Risk for activity intolerance will decrease Outcome: Progressing   Problem: Pain Managment: Goal: General experience of comfort will improve and/or be controlled Outcome: Progressing

## 2024-06-24 NOTE — Progress Notes (Addendum)
 Pt is requesting something to help with the irritation on the bridge of pt nose. MD Cleatus made aware.  Update 2334: See new order.

## 2024-06-24 NOTE — Progress Notes (Signed)
 Occupational Therapy Treatment Patient Details Name: Gina Bates MRN: [id number] DOB: December 30, 1960 Today's Date: 06/24/2024   History of present illness Gina Bates is a 63 y.o. female with medical history significant of COPD, elevated troponin history, who presents to the ER with sudden onset of shortness of breath and hypoxia.  Patient admitted on 06/18/24 with acute on chronic hypoxic respiratory failure.   OT comments  Pt seen for OT treatment on this date. Upon arrival to room pt sidelying on R side, agreeable to tx. Pt completed bed mobility with MODI, STS from lowest bed height with CGA, and simulated BSC t/f with 1HHA. Pt remains limited by activity tolerance however understands the importance to OOB mobility. Pt making good progress toward goals, will continue to follow POC. Discharge recommendation remains appropriate.        If plan is discharge home, recommend the following:  A little help with walking and/or transfers;A little help with bathing/dressing/bathroom   Equipment Recommendations  BSC/3in1    Recommendations for Other Services      Precautions / Restrictions Precautions Precautions: Fall Recall of Precautions/Restrictions: Intact Restrictions Weight Bearing Restrictions Per Provider Order: No       Mobility Bed Mobility Overal bed mobility: Modified Independent Bed Mobility: Supine to Sit, Sit to Supine           General bed mobility comments: Increased time due to fatigue    Transfers Overall transfer level: Needs assistance Equipment used: 2 person hand held assist Transfers: Sit to/from Stand Sit to Stand: Contact guard assist           General transfer comment: Multiple STS from lowest bed height with CGA, ambulated within room ~55ft     Balance Overall balance assessment: Needs assistance Sitting-balance support: No upper extremity supported, Feet supported Sitting balance-Leahy Scale: Good     Standing balance support: Single  extremity supported, During functional activity Standing balance-Leahy Scale: Fair                             ADL either performed or assessed with clinical judgement   ADL Overall ADL's : Needs assistance/impaired Eating/Feeding: Set up;Sitting   Grooming: Wash/dry face;Set up;Sitting                   Toilet Transfer: BSC/3in1;Rolling walker (2 wheels);Cueing for safety;Ambulation;Minimal assistance Toilet Transfer Details (indicate cue type and reason): Simulated         Functional mobility during ADLs: Contact guard assist;Rolling walker (2 wheels) General ADL Comments: Simulated toilet transfer <>BSC, CGA     Communication Communication Communication: No apparent difficulties   Cognition Arousal: Alert Behavior During Therapy: WFL for tasks assessed/performed, Anxious Cognition: No apparent impairments                               Following commands: Intact        Cueing   Cueing Techniques: Verbal cues  Exercises Exercises: Other exercises Other Exercises Other Exercises: Edu: Role of OT session, safe ADL completion, importance of OOB activity    Shoulder Instructions       General Comments Spo2 3L spo2 level 94% pre/post mobility    Pertinent Vitals/ Pain       Pain Assessment Pain Assessment: No/denies pain  Frequency  Min 2X/week        Progress Toward Goals  OT Goals(current goals can now be found in the care plan section)  Progress towards OT goals: Progressing toward goals  Acute Rehab OT Goals OT Goal Formulation: With patient Time For Goal Achievement: 07/07/24 Potential to Achieve Goals: Good ADL Goals Pt Will Perform Grooming: Independently;standing Pt Will Perform Lower Body Dressing: sit to/from stand Pt Will Transfer to Toilet: Independently;ambulating;regular height toilet   AM-PAC OT 6 Clicks Daily Activity      Outcome Measure   Help from another person eating meals?: None Help from another person taking care of personal grooming?: A Little Help from another person toileting, which includes using toliet, bedpan, or urinal?: A Little Help from another person bathing (including washing, rinsing, drying)?: A Little Help from another person to put on and taking off regular upper body clothing?: None Help from another person to put on and taking off regular lower body clothing?: None 6 Click Score: 21    End of Session Equipment Utilized During Treatment: Oxygen  OT Visit Diagnosis: Other abnormalities of gait and mobility (R26.89);Muscle weakness (generalized) (M62.81)   Activity Tolerance Patient tolerated treatment well   Patient Left in bed;with call bell/phone within reach;with family/visitor present   Nurse Communication Mobility status        Time: 8567-8554 OT Time Calculation (min): 13 min  Charges: OT General Charges $OT Visit: 1 Visit OT Treatments $Therapeutic Activity: 8-22 mins  Larraine Colas M.S. OTR/L  06/24/24, 4:04 PM

## 2024-06-24 NOTE — Progress Notes (Signed)
 Palliative Care Progress Note, Assessment & Plan   Patient Name: Gina Bates       Date: 06/24/2024 DOB: Jul 16, 1961  Age: 63 y.o. MRN#: [id number] Attending Physician: Josette Ade, MD Primary Care Physician: Kerry Rollene SAUNDERS, MD Admit Date: 06/18/2024  Subjective: Patient is lying in bed, awake and alert.  She acknowledges my presence and is able to make her wishes known.  Her husband and daughter are at bedside during my visit.  HPI: 63 y.o. female  with past medical history of COPD admitted on 06/18/2024 with sudden onset of SOB an hypoxia.   On EMS arrival, patient was tripod breathing and short of breath, found to be hypoxic with oxygenation saturation of 80% on room air.  Patient given albuterol , Solu-Medrol , and placed on CPAP.   Upon arrival to ED, patient continued to exhibit significant shortness of breath and was placed on BiPAP.   Respiratory viral panel revealed rhinovirus.  Strep pneumoniae urine antigen was positive. Patient is being treated for acute on chronic hypoxic respiratory failure due to AECOPD, community-acquired pneumonia, and elevated troponins due to demand ischemia and questionable CHF (now resolved).   Cardiology was consulted but patient refused CTA of the chest to rule out PE.   Patient was very anxious and apprehensive about being weaned off of BiPAP.  As needed Xanax  and morphine  given prior to transition off of BiPAP.  Patient has now been weaned off of BiPAP and is now on nasal cannula.   PMT was consulted to support patient and family with goals of care discussions.  Summary of counseling/coordination of care: Extensive chart review completed prior to meeting patient including labs, vital signs, imaging, progress notes, orders, and available advanced  directive documents from current and previous encounters.   After reviewing the patient's chart and assessing the patient at bedside, I spoke with patient in regards to symptom management and goals of care.   Symptoms assessed.  Patient endorses her breathing has improved since yesterday.  She shares overall she is not having shortness of breath.  However, she endorses increased anxiety.  She shares the transition from ICU to her new room was moved but she is now feeling more anxious as the day goes on.  Therapeutic silence and active listening provided.  Patient shares that she is grateful the medical team continues to work at her own place but she is feeling generalized anxiety.   Reminded patient that she has as needed Xanax  available.  She requests a dose now.  Additionally, discussed that Lexapro  continues to be given daily but will not reach steady state for several weeks.  She endorses understanding.  Advised RN that patient is requesting as needed dose of Xanax .  I highlighted our discussion from yesterday.  We again discussed CODE STATUS and advance care planning.  Encouraged patient to consider her options prior to any urgent or emergent event when she may not be able to speak to herself.  Specifically, patient endorses that she is willing to be accepting of ventilatory support if needed.  She also shares that she would not want to live long-term on machines.  Full code and full scope remain with understanding that patient would never be  accepting of tracheostomy care today.  She shares that since she is feeling better and symptoms she is hopeful to be able to leave the hospital soon.  No change to plan of care at this time.  PMT to continue to follow and support.  Full code and full scope remain.   Physical Exam Vitals reviewed.  Constitutional:      General: She is not in acute distress.    Appearance: She is normal weight.  HENT:     Head: Normocephalic.  Cardiovascular:      Rate and Rhythm: Normal rate.  Pulmonary:     Effort: Pulmonary effort is normal.     Comments: [place] in place Skin:    General: Skin is warm and dry.  Neurological:     Mental Status: She is alert and oriented to person, place, and time.  Psychiatric:        Mood and Affect: Mood is anxious.        Behavior: Behavior normal.             25 minute visit includes: Detailed review of medical records (labs, imaging, vital signs), medically appropriate exam (mental status, respiratory, cardiac, skin), discussed with treatment team, counseling and educating patient, family and staff, documenting clinical information, medication management and coordination of care.  Lamarr L. Arvid, DNP, FNP-BC Palliative Medicine Team

## 2024-06-25 DIAGNOSIS — R0603 Acute respiratory distress: Secondary | ICD-10-CM | POA: Diagnosis not present

## 2024-06-25 MED ORDER — PREDNISONE 20 MG PO TABS
40.0000 mg | ORAL_TABLET | Freq: Every day | ORAL | Status: DC
  Administered 2024-06-26: 40 mg via ORAL
  Filled 2024-06-25: qty 2

## 2024-06-25 MED ORDER — VITAMIN C 500 MG PO TABS
500.0000 mg | ORAL_TABLET | Freq: Two times a day (BID) | ORAL | Status: DC
  Administered 2024-06-25 – 2024-06-26 (×3): 500 mg via ORAL
  Filled 2024-06-25 (×3): qty 1

## 2024-06-25 MED ORDER — ZINC SULFATE 220 (50 ZN) MG PO CAPS
220.0000 mg | ORAL_CAPSULE | Freq: Every day | ORAL | Status: DC
  Administered 2024-06-25 – 2024-06-26 (×2): 220 mg via ORAL
  Filled 2024-06-25 (×2): qty 1

## 2024-06-25 NOTE — Progress Notes (Signed)
 Nutrition Follow-up  DOCUMENTATION CODES:   Non-severe (moderate) malnutrition in context of chronic illness  INTERVENTION:   -Continue Boost Plus po TID- Each supplement provides 360kcal and 14g protein.   -Continue Magic cup TID with meals, each supplement provides 290 kcal and 9 grams of protein -Continue MVI po daily  -Continue Thiamine  100mg  po daily x 7 days  -Continue regular diet -500 mg vitamin C  BID -220 mg zinc  sulfate daily x 14 days   NUTRITION DIAGNOSIS:   Moderate Malnutrition related to catabolic illness (COPD) as evidenced by moderate fat depletion, moderate muscle depletion, severe muscle depletion.  Ongoing  GOAL:   Patient will meet greater than or equal to 90% of their needs  Progressing   MONITOR:   PO intake, Supplement acceptance, Labs, Weight trends, I & O's, Skin  REASON FOR ASSESSMENT:   Consult Assessment of nutrition requirement/status  ASSESSMENT:   63 y/o female with h/o anxiety, CAD, HLD and COPD who is admitted with rhinovirus, PNA, sepsis, SIRS, demand ischemia and COPD exacerbation.  11/24- weaned off bi pap and transitioned to nasala cannula, miralax  ordered for constipation  Reviewed I/O's: +820 ml x 24 hours and +1.5 L since admission  UOP: 270 ml x 24 hours  Case discussed wiuth mobility tech, who reports plan to walk inside room at time of visit.   Spoke with patient at bedside, who was pleasant and in good spirits today. No family at bedside. Patient reports feeling a little bite better today and appetite is improving. She consumed 100^% of breakfast (coffee, banana, and muffin). She admits that she has also been snacking on treats (candy) that family has brought in. She she has been consuming most of her meals. Documented meal completions 15-100%.   Discussed importance of good meal and supplement intake to promote healing. Patient amenable to drink Boost, but states she only got one yesterday. RD will verify orders.    Reviewed weights this hospitalization. Suspect reading of 47.1 kg is an outlier. Weight have otherwise been trending between 43.6 kg- 44.1 kg.   Per palliative care notes, plan for full code and full scope care; would not be accepting of tracheostomy care.   Per TOC notes, plan to discharge home with home health once medically stable.   Medications reviewed and include solu-medrol , miralax , and thiamine .   Labs reviewed: CBGS: 152 (inpatient orders for glycemic control are none).    Diet Order:   Diet Order             Diet regular Room service appropriate? Yes; Fluid consistency: Thin  Diet effective now                   EDUCATION NEEDS:   Education needs have been addressed  Skin:  Skin Assessment: Skin Integrity Issues: Skin Integrity Issues:: Stage II Stage II: nose  Last BM:  06/24/24  Height:   Ht Readings from Last 1 Encounters:  06/19/24 5' (1.524 m)    Weight:   Wt Readings from Last 1 Encounters:  06/25/24 44.1 kg    Ideal Body Weight:  45.45 kg  BMI:  Body mass index is 18.99 kg/m.  Estimated Nutritional Needs:   Kcal:  1400-1600kcal/day  Protein:  70-80g/day  Fluid:  1.4-1.6L/day    Margery ORN, RD, LDN, CDCES Registered Dietitian III Certified Diabetes Care and Education Specialist If unable to reach this RD, please use RD Inpatient group chat on secure chat between hours of 8am-4 pm daily

## 2024-06-25 NOTE — Plan of Care (Signed)
  Problem: Education: Goal: Knowledge of General Education information will improve Description: Including pain rating scale, medication(s)/side effects and non-pharmacologic comfort measures Outcome: Progressing   Problem: Health Behavior/Discharge Planning: Goal: Ability to manage health-related needs will improve Outcome: Progressing   Problem: Clinical Measurements: Goal: Will remain free from infection Outcome: Progressing   Problem: Clinical Measurements: Goal: Respiratory complications will improve Outcome: Progressing   Problem: Clinical Measurements: Goal: Cardiovascular complication will be avoided Outcome: Progressing   Problem: Activity: Goal: Risk for activity intolerance will decrease Outcome: Progressing   Problem: Coping: Goal: Level of anxiety will decrease Outcome: Progressing   Problem: Pain Managment: Goal: General experience of comfort will improve and/or be controlled Outcome: Progressing

## 2024-06-25 NOTE — Progress Notes (Signed)
 Progress Note   Patient: Gina Bates FMW:[id number] DOB: 1960-11-23 DOA: 06/18/2024     7 DOS: the patient was seen and examined on 06/25/2024   Brief hospital course: 63 y.o. female with medical history significant of COPD, elevated troponin history, who presents to the ER with sudden onset of shortness of breath and hypoxia.  Patient brought in by EMS on CPAP.  Oxygen sat was 80% on room air apparently when they arrived.  Patient has received albuterol  and Solu-Medrol .  Still having significant shortness of breath.  Patient was in tripod position right.  Workup in the ER showed suspected pneumonia but also fluid overload.  Suspected new CHF.  Patient has been placed on BiPAP.  Additionally troponin was noted to be more than 100.  No chest pain.  No peripheral edema.  Patient has been admitted with acute on chronic hypoxic respiratory failure.    11/20.  Patient admitted with respiratory distress and placed on BiPAP.  We tried to take off BiPAP this morning but needed to go back on BiPAP secondary to shortness of breath.  Continue antibiotics for pneumonia and Solu-Medrol .  Patient initially placed on heparin  drip secondary to elevated troponin.  No complaints of chest pain. 11/21.  Patient required BiPAP and an extra dose of Solu-Medrol  last night.  Respiratory panel positive for rhinovirus.  Streptococcus pneumoniae urinary antigen positive.  Patient still feels short of breath.   11/22 in the evening, had worsening shortness of breath and was tripoding and needed to go back on BiPAP. 11/23.  Will start low-dose Lexapro .  Patient on heated high flow nasal cannula 30 L flow and 33% oxygen. 11/24.  Patient feeling little bit better and breathing little bit easier.  Patient switched from heated high flow nasal cannula 39% FiO2 35 L flow over to bubble high flow nasal cannula 6 L.  Transfer out of the ICU. 11/25.  Doing better today but feeling weak.  Down to 3.5 L nasal cannula. 11/26: Reportedly  improving, on 4 L [place].  Change IV Solu-Medrol  to po prednisone , continues to feel weak -plan to discharge tomorrow  Assessment and Plan: Acute respiratory distress Patient placed on BiPAP on presentation.  Patient was either on heated high flow nasal cannula or BiPAP up until this point.  Switched over to bubble high flow nasal cannula 6 L on 11/20 4 in the afternoon.  Previously prescribed Roxanol for air hunger.  On Xanax  as needed.  Continue Lexapro  to prevent anxiety.  Appreciate palliative care consultation -full code.  Patient now down to 4 L   Acute on chronic respiratory failure with hypoxia Patient requiring BiPAP on presentation.  This morning on heated high flow nasal cannula 39% oxygen and 35 L flow.  Patient switch over to 6 L bubble high flow nasal cannula on 11/24.  Today down to 4 L.  Normally wears 2 L of oxygen.   Severe sepsis (HCC) Present on admission with tachycardia, tachypnea, leukocytosis and lobar pneumonia and lactic acidosis.  Continue Rocephin  and Zithromax  completed.  Respiratory viral panel positive for rhinovirus.  Urinary Streptococcus pneumoniae antigen positive.   Lobar pneumonia Rocephin  and Zithromax  completed.  1 bottle positive for staph hominis which is likely contamination.  Urinary Streptococcus pneumoniae antigen positive.   COPD exacerbation (HCC) Change IV Solumedrol to po prednisone  (taper at home) and nebulizer treatments   Constipation Continue MiraLAX .  As needed lactulose .   Anxiety As needed Xanax .  Continue Lexapro    Malnutrition of moderate degree Continue  supplements   Pressure injury of skin From continuous BiPAP while here. Wound 06/20/24 1217 Pressure Injury Nose Mid Stage 2 -  Partial thickness loss of dermis presenting as a shallow open injury with a red, pink wound bed without slough. (Active)   Myocardial injury This is demand ischemia from respiratory distress pneumonia and COPD exacerbation.  Heparin  drip started on  admission with elevated troponin, continue aspirin  and Zocor .  LDL 103.  Echocardiogram shows an EF of 50 to 55%.   Lactic acidosis resolved   Subjective: Patient feels like she is breathing better. On 4L [place] Changed IV Solu-Medrol  to p.o. prednisone  Anticipate discharge tomorrow  Physical Exam: Vitals:   06/25/24 0145 06/25/24 0410 06/25/24 0500 06/25/24 0816  BP: (!) 103/55 (!) 92/56  (!) 92/55  Pulse:  78  72  Resp: 20 20  16   Temp:  98 F (36.7 C)  98 F (36.7 C)  TempSrc:      SpO2:  100%  99%  Weight:   44.1 kg   Height:       Physical Exam HENT:     Head: Normocephalic.     Mouth/Throat:     Pharynx: No oropharyngeal exudate.  Eyes:     General: Lids are normal.     Conjunctiva/sclera: Conjunctivae normal.  Cardiovascular:     Rate and Rhythm: Normal rate and regular rhythm.     Heart sounds: Normal heart sounds, S1 normal and S2 normal.  Pulmonary:     Effort: No accessory muscle usage.     Breath sounds: Examination of the right-lower field reveals decreased breath sounds. Examination of the left-lower field reveals decreased breath sounds. Decreased breath sounds present. No wheezing, rhonchi or rales.  Abdominal:     Palpations: Abdomen is soft.     Tenderness: There is no abdominal tenderness.  Musculoskeletal:     Right lower leg: No swelling.     Left lower leg: No swelling.  Skin:    General: Skin is warm.     Findings: No rash.  Neurological:     Mental Status: She is alert and oriented to person, place, and time.     Data Reviewed: No new data  Family Communication: Granddaughter at bedside  Disposition: Status is: Inpatient Remains inpatient appropriate because: Patient finally started moving better air and down to 3.5 L nasal cannula close to her baseline.  Continue working on her strength.  May need another day or so here in the hospital depending on progress with her strength  Planned Discharge Destination: Home with Home  Health    Time spent: 35 minutes  Author: Laree Lock, MD 06/25/2024 9:03 AM  For on call review [email or web address].

## 2024-06-25 NOTE — Progress Notes (Signed)
 Mobility Specialist - Progress Note  Pre-mobility: HR-86, SpO2-98%  During mobility: HR-88,  SpO2-98%  Post-mobility: HR-70, SPO2-95%   06/25/24 0900  Mobility  Activity Ambulated with assistance;Stood at bedside;Dangled on edge of bed  Level of Assistance Contact guard assist, steadying assist  Assistive Device Front wheel walker;None  Distance Ambulated (ft) 10 ft  Range of Motion/Exercises Active;All extremities  Activity Response Tolerated well  Mobility visit 1 Mobility  Mobility Specialist Start Time (ACUTE ONLY) [id number]  Mobility Specialist Stop Time (ACUTE ONLY) 0905  Mobility Specialist Time Calculation (min) (ACUTE ONLY) 21 min    Pt was supine in bed with the HOPB elevated on O2 @ 4L and dietitian in the room upon entry. Pt agreed to mobility. Pt O2 vitals were taken throughout activity as a precaution. Pt is able today to get to the EOB independently with bed features. Pt is able today to STS independently with 2 WW multiple times. Pt is able to ambulate well forwards and backwards. After activity pt is repositioned in the recliner with needs in reach and chair alarm on.  Clem Rodes Mobility Specialist 06/25/24, 9:38 AM

## 2024-06-26 DIAGNOSIS — R0603 Acute respiratory distress: Secondary | ICD-10-CM | POA: Diagnosis not present

## 2024-06-26 MED ORDER — HYDROXYZINE HCL 10 MG PO TABS: 10.0000 mg | ORAL_TABLET | Freq: Three times a day (TID) | ORAL | Status: DC | PRN

## 2024-06-26 MED ORDER — HYDROXYZINE HCL 10 MG PO TABS: 10.0000 mg | ORAL_TABLET | Freq: Three times a day (TID) | ORAL | 0 refills | Status: AC | PRN

## 2024-06-26 MED ORDER — PREDNISONE 20 MG PO TABS: ORAL_TABLET | ORAL | 0 refills | Status: AC

## 2024-06-26 MED ORDER — ASPIRIN 81 MG PO TBEC: 81.0000 mg | DELAYED_RELEASE_TABLET | Freq: Every day | ORAL | 3 refills | Status: AC

## 2024-06-26 MED ORDER — ESCITALOPRAM OXALATE 10 MG PO TABS: 10.0000 mg | ORAL_TABLET | Freq: Every day | ORAL | 3 refills | Status: AC

## 2024-06-26 NOTE — Discharge Summary (Signed)
 Physician Discharge Summary   Patient: Gina Bates MRN: [id number] DOB: 24-Jan-1961  Admit date:     06/18/2024  Discharge date: 06/26/24  Discharge Physician: Laree Lock   PCP: Kerry Rollene SAUNDERS, MD   Recommendations at discharge:   Follow-up with PCP outpatient in 2 weeks for ongoing care  Follow-up with palliative outpatient Home PT arranged  Discharge Diagnoses: Principal Problem:   Acute respiratory distress Active Problems:   Acute on chronic respiratory failure with hypoxia (HCC)   Severe sepsis (HCC)   Lobar pneumonia   COPD exacerbation (HCC)   Lactic acidosis   Myocardial injury   Pneumonia of left lower lobe due to infectious organism   Coronary artery disease of native artery of native heart with stable angina pectoris   Rhinovirus infection   Pressure injury of skin   Malnutrition of moderate degree   Sinus tachycardia   Anxiety   Constipation   Hospital Course: 63 y.o. female with medical history significant of COPD, elevated troponin history, who presents to the ER with sudden onset of shortness of breath and hypoxia.  Patient brought in by EMS on CPAP.  Oxygen sat was 80% on room air apparently when they arrived.  Patient has received albuterol  and Solu-Medrol .  Still having significant shortness of breath.  Patient was in tripod position right.  Workup in the ER showed suspected pneumonia but also fluid overload.  Suspected new CHF.  Patient has been placed on BiPAP.  Additionally troponin was noted to be more than 100.  No chest pain.  No peripheral edema.  Patient has been admitted with acute on chronic hypoxic respiratory failure.    11/20.  Patient admitted with respiratory distress and placed on BiPAP.  We tried to take off BiPAP this morning but needed to go back on BiPAP secondary to shortness of breath.  Continue antibiotics for pneumonia and Solu-Medrol .  Patient initially placed on heparin  drip secondary to elevated troponin.  No complaints of  chest pain. 11/21.  Patient required BiPAP and an extra dose of Solu-Medrol  last night.  Respiratory panel positive for rhinovirus.  Streptococcus pneumoniae urinary antigen positive.  Patient still feels short of breath.   11/22 in the evening, had worsening shortness of breath and was tripoding and needed to go back on BiPAP. 11/23.  Will start low-dose Lexapro .  Patient on heated high flow nasal cannula 30 L flow and 33% oxygen. 11/24.  Patient feeling little bit better and breathing little bit easier.  Patient switched from heated high flow nasal cannula 39% FiO2 35 L flow over to bubble high flow nasal cannula 6 L.  Transfer out of the ICU. 11/25.  Doing better today but feeling weak.  Down to 3.5 L nasal cannula. 11/26: Reportedly improving, on 4 L [place].  Change IV Solu-Medrol  to po prednisone , continues to feel weak -plan to discharge tomorrow 11/27: Improved to 3L [place], discharged on prednisone  taper to follow up outpatient  Assessment and Plan: Acute respiratory distress Acute on chronic respiratory failure with hypoxia - Patient placed on BiPAP on presentation.  Patient was either on heated high flow nasal cannula or BiPAP up until this point.  Now down to 3L  - Appreciate palliative care consultation -full code, follow up outpatient   Severe sepsis Lobar pneumonia - Present on admission with tachycardia, tachypnea, leukocytosis and lobar pneumonia and lactic acidosis.  Rocephin  and Zithromax  completed.   - Respiratory viral panel positive for rhinovirus.  Urinary Streptococcus pneumoniae antigen positive. - 1  bottle positive for staph hominis which is likely contamination   COPD exacerbation (HCC) - s/p IV Solumedrol, discharged on prednisone  taper  Anxiety As needed Hydroxyzine .  Continue Lexapro    Malnutrition of moderate degree Continue supplements   Pressure injury of skin - improved From continuous BiPAP while here. Wound 06/20/24 1217 Pressure Injury Nose Mid Stage 2 -   Partial thickness loss of dermis presenting as a shallow open injury with a red, pink wound bed without slough. (Active)    Myocardial injury This is demand ischemia from respiratory distress pneumonia and COPD exacerbation.   Heparin  drip started on admission with elevated troponin, continue aspirin  and Zocor .  LDL 103.  Echocardiogram shows an EF of 50 to 55%.   Lactic acidosis resolved   Pain control - [place]  Controlled Substance Reporting System database was reviewed. and patient was instructed, not to drive, operate heavy machinery, perform activities at heights, swimming or participation in water activities or provide baby-sitting services while on Pain, Sleep and Anxiety Medications; until their outpatient Physician has advised to do so again. Also recommended to not to take more than prescribed Pain, Sleep and Anxiety Medications.  Consultants: Cardiology Procedures performed: None  Disposition: Home health Diet recommendation:  Discharge Diet Orders (From admission, onward)     Start     Ordered   06/26/24 0000  Diet - low sodium heart healthy        06/26/24 1416           DISCHARGE MEDICATION: Allergies as of 06/26/2024   No Known Allergies      Medication List     TAKE these medications    albuterol  108 (90 Base) MCG/ACT inhaler Commonly known as: VENTOLIN  HFA Inhale 2 puffs into the lungs every 6 (six) hours as needed for shortness of breath.   albuterol  (2.5 MG/3ML) 0.083% nebulizer solution Commonly known as: PROVENTIL  Take 3 mLs (2.5 mg total) by nebulization every 6 (six) hours as needed for wheezing or shortness of breath.   aspirin  EC 81 MG tablet Take 1 tablet (81 mg total) by mouth daily. Swallow whole. Start taking on: June 27, 2024   escitalopram  10 MG tablet Commonly known as: LEXAPRO  Take 1 tablet (10 mg total) by mouth daily. Start taking on: June 27, 2024   fluticasone-salmeterol 250-50 MCG/ACT Aepb Commonly known as:  ADVAIR Inhale 1 puff into the lungs 2 (two) times daily.   hydrOXYzine  10 MG tablet Commonly known as: ATARAX  Take 1 tablet (10 mg total) by mouth 3 (three) times daily as needed for anxiety.   multivitamin with minerals Tabs tablet Take 1 tablet by mouth daily.   predniSONE  20 MG tablet Commonly known as: DELTASONE  Take 2 tablets (40 mg total) by mouth daily with breakfast for 2 days, THEN 1.5 tablets (30 mg total) daily with breakfast for 3 days, THEN 1 tablet (20 mg total) daily with breakfast for 3 days, THEN 0.5 tablets (10 mg total) daily with breakfast for 3 days. Start taking on: June 27, 2024   simvastatin  40 MG tablet Commonly known as: ZOCOR  Take 40 mg by mouth at bedtime.   Spiriva HandiHaler 18 MCG Caps Generic drug: Tiotropium Bromide Place 1 capsule into inhaler and inhale in the morning.   Spiriva HandiHaler 18 MCG Caps Generic drug: Tiotropium Bromide Place 18 mcg into inhaler and inhale.        Contact information for after-discharge care     Home Medical Care     [facility] .   Service: Home Health Services Contact information: [phone] [place]  [phone]                    Discharge Exam: Filed Weights   06/24/24 0309 06/25/24 0500 06/26/24 0535  Weight: 44.1 kg 44.1 kg 41.6 kg   Cardiovascular:     Rate and Rhythm: Normal rate and regular rhythm.     Heart sounds: Normal heart sounds, S1 normal and S2 normal.  Pulmonary:     Effort: No accessory muscle usage.     Breath sounds: Examination of the right-lower field reveals decreased breath sounds. Examination of the left-lower field reveals decreased breath sounds. Decreased breath sounds present. No wheezing, rhonchi or rales.  Abdominal:     Palpations: Abdomen is soft.     Tenderness: There is no abdominal tenderness.  Musculoskeletal:     Right lower leg: No swelling.     Left lower leg: No swelling.  Skin:     General: Skin is warm.     Findings: No rash.  Neurological:     Mental Status: She is alert and oriented to person, place, and time  Condition at discharge: fair  The results of significant diagnostics from this hospitalization (including imaging, microbiology, ancillary and laboratory) are listed below for reference.   Imaging Studies: ECHOCARDIOGRAM COMPLETE Result Date: 06/19/2024    ECHOCARDIOGRAM REPORT   Patient Name:   MAYELI BORNHORST Date of Exam: 06/19/2024 Medical Rec #:  [id number]      Height:       61.0 in Accession #:    [id number]     Weight:       96.1 lb Date of Birth:  1961/03/29       BSA:          1.383 m Patient Age:    63 years       BP:           112/69 mmHg Patient Gender: F              HR:           106 bpm. Exam Location:  [facility] Procedure: 2D Echo, Cardiac Doppler and Color Doppler (Both Spectral and Color            Flow Doppler were utilized during procedure). Indications:     CHF--acute diastolic I50.31  History:         Patient has prior history of Echocardiogram examinations, most                  recent 06/26/2021. COPD.  Sonographer:     Christopher Furnace Referring Phys:  [id number] EMERY LITTIE FUSS Diagnosing Phys: Evalene Lunger MD  Sonographer Comments: Technically challenging study due to limited acoustic windows and no apical window. Image acquisition challenging due to COPD. IMPRESSIONS  1. Left ventricular ejection fraction, by estimation, is 50 to 55%. The left ventricle has low normal function. The left ventricle has no regional wall motion abnormalities. Left ventricular diastolic parameters are indeterminate.  2. Right ventricular systolic function is normal. The right ventricular size is normal. Tricuspid regurgitation signal is inadequate for assessing PA pressure.  3. The mitral valve is normal in structure. Mild mitral valve regurgitation. No evidence of mitral stenosis.  4. The aortic valve is normal in structure. Aortic valve regurgitation is not visualized. Aortic  valve sclerosis/calcification is present, without any evidence of aortic  stenosis.  5. The inferior vena cava is normal in size with greater than 50% respiratory variability, suggesting right atrial pressure of 3 mmHg. FINDINGS  Left Ventricle: Left ventricular ejection fraction, by estimation, is 50 to 55%. The left ventricle has low normal function. The left ventricle has no regional wall motion abnormalities. Strain was performed and the global longitudinal strain is indeterminate. The left ventricular internal cavity size was normal in size. There is no left ventricular hypertrophy. Left ventricular diastolic parameters are indeterminate. Right Ventricle: The right ventricular size is normal. No increase in right ventricular wall thickness. Right ventricular systolic function is normal. Tricuspid regurgitation signal is inadequate for assessing PA pressure. Left Atrium: Left atrial size was normal in size. Right Atrium: Right atrial size was normal in size. Pericardium: There is no evidence of pericardial effusion. Mitral Valve: The mitral valve is normal in structure. Mild mitral valve regurgitation. No evidence of mitral valve stenosis. Tricuspid Valve: The tricuspid valve is normal in structure. Tricuspid valve regurgitation is not demonstrated. No evidence of tricuspid stenosis. Aortic Valve: The aortic valve is normal in structure. Aortic valve regurgitation is not visualized. Aortic valve sclerosis/calcification is present, without any evidence of aortic stenosis. Pulmonic Valve: The pulmonic valve was normal in structure. Pulmonic valve regurgitation is not visualized. No evidence of pulmonic stenosis. Aorta: The aortic root is normal in size and structure. Venous: The inferior vena cava is normal in size with greater than 50% respiratory variability, suggesting right atrial pressure of 3 mmHg. IAS/Shunts: No atrial level shunt detected by color flow Doppler. Additional Comments: 3D was performed not  requiring image post processing on an independent workstation and was indeterminate.  LEFT VENTRICLE PLAX 2D LVIDd:         3.70 cm LVIDs:         2.40 cm LV PW:         0.90 cm LV IVS:        0.90 cm LVOT diam:     2.00 cm LVOT Area:     3.14 cm  LEFT ATRIUM         Index LA diam:    2.30 cm 1.66 cm/m   AORTA Ao Root diam: 2.70 cm  SHUNTS Systemic Diam: 2.00 cm Evalene Lunger MD Electronically signed by Evalene Lunger MD Signature Date/Time: 06/19/2024/2:39:32 PM    Final    DG Chest Portable 1 View Result Date: 06/18/2024 EXAM: 1 VIEW(S) XRAY OF THE CHEST 06/18/2024 08:40:00 PM COMPARISON: None available. CLINICAL HISTORY: sob FINDINGS: LUNGS AND PLEURA: Hyperinflation of lungs. Emphysema. Superimposed patchy infiltrate of the left lung base in keeping with acute infection or aspiration. Small bilateral pleural effusions. No pneumothorax. HEART AND MEDIASTINUM: Aortic calcification. No acute abnormality of the cardiac and mediastinal silhouettes. BONES AND SOFT TISSUES: No acute osseous abnormality. IMPRESSION: 1. Hyperinflation of lungs and emphysema. 2. Superimposed patchy infiltrate of the left lung base, consistent with acute infection or aspiration. 3. Small bilateral pleural effusions. Electronically signed by: Dorethia Molt MD 06/18/2024 08:52 PM EST RP Workstation: [id number]    Microbiology: Results for orders placed or performed during the hospital encounter of 06/18/24  Resp panel by RT-PCR (RSV, Flu A&B, Covid) Anterior Nasal Swab     Status: None   Collection Time: 06/18/24  8:24 PM   Specimen: Anterior Nasal Swab  Result Value Ref Range Status   SARS Coronavirus 2 by RT PCR NEGATIVE NEGATIVE Final    Comment: (NOTE) SARS-CoV-2 target nucleic acids are NOT DETECTED.  The SARS-CoV-2 RNA is generally detectable in upper respiratory specimens during the acute phase of infection. The lowest concentration of SARS-CoV-2 viral copies this assay can detect is 138 copies/mL. A negative  result does not preclude SARS-Cov-2 infection and should not be used as the sole basis for treatment or other patient management decisions. A negative result may occur with  improper specimen collection/handling, submission of specimen other than nasopharyngeal swab, presence of viral mutation(s) within the areas targeted by this assay, and inadequate number of viral copies(<138 copies/mL). A negative result must be combined with clinical observations, patient history, and epidemiological information. The expected result is Negative.  Fact Sheet for Patients:  bloggercourse.com  Fact Sheet for Healthcare Providers:  seriousbroker.it  This test is no t yet approved or cleared by the [place]  FDA and  has been authorized for detection and/or diagnosis of SARS-CoV-2 by FDA under an Emergency Use Authorization (EUA). This EUA will remain  in effect (meaning this test can be used) for the duration of the COVID-19 declaration under Section 564(b)(1) of the Act, 21 U.S.C.section 360bbb-3(b)(1), unless the authorization is terminated  or revoked sooner.       Influenza A by PCR NEGATIVE NEGATIVE Final   Influenza B by PCR NEGATIVE NEGATIVE Final    Comment: (NOTE) The Xpert Xpress SARS-CoV-2/FLU/RSV plus assay is intended as an aid in the diagnosis of influenza from Nasopharyngeal swab specimens and should not be used as a sole basis for treatment. Nasal washings and aspirates are unacceptable for Xpert Xpress SARS-CoV-2/FLU/RSV testing.  Fact Sheet for Patients: bloggercourse.com  Fact Sheet for Healthcare Providers: seriousbroker.it  This test is not yet approved or cleared by the [place]  FDA and has been authorized for detection and/or diagnosis of SARS-CoV-2 by FDA under an Emergency Use Authorization (EUA). This EUA will remain in effect (meaning this test can be used)  for the duration of the COVID-19 declaration under Section 564(b)(1) of the Act, 21 U.S.C. section 360bbb-3(b)(1), unless the authorization is terminated or revoked.     Resp Syncytial Virus by PCR NEGATIVE NEGATIVE Final    Comment: (NOTE) Fact Sheet for Patients: bloggercourse.com  Fact Sheet for Healthcare Providers: seriousbroker.it  This test is not yet approved or cleared by the [place]  FDA and has been authorized for detection and/or diagnosis of SARS-CoV-2 by FDA under an Emergency Use Authorization (EUA). This EUA will remain in effect (meaning this test can be used) for the duration of the COVID-19 declaration under Section 564(b)(1) of the Act, 21 U.S.C. section 360bbb-3(b)(1), unless the authorization is terminated or revoked.  Performed at [facility], [address] [place], [place] [address]   Blood culture (routine x 2)     Status: Abnormal   Collection Time: 06/18/24  8:24 PM   Specimen: BLOOD RIGHT ARM  Result Value Ref Range Status   Specimen Description   Final    BLOOD RIGHT ARM Performed at [facility], [address] [place], [place] [address]    Special Requests   Final    BOTTLES DRAWN AEROBIC AND ANAEROBIC Blood Culture results may not be optimal due to an inadequate volume of blood received in culture bottles Performed at [facility], [address] [place], [place] [address]    Culture  Setup Time   Final    GRAM POSITIVE COCCI ANAEROBIC BOTTLE ONLY CRITICAL RESULT CALLED TO, READ BACK BY AND VERIFIED WITH: ALEX CHAPPELL PHARMD 1411 06/19/24 HNM  Culture (A)  Final    STAPHYLOCOCCUS HOMINIS THE SIGNIFICANCE OF ISOLATING THIS ORGANISM FROM A SINGLE SET OF BLOOD CULTURES WHEN MULTIPLE SETS ARE DRAWN IS UNCERTAIN. PLEASE NOTIFY THE MICROBIOLOGY DEPARTMENT WITHIN ONE WEEK IF SPECIATION AND SENSITIVITIES ARE REQUIRED. Performed at [facility], [address], [place], [place] [address]    Report Status 06/21/2024 FINAL  Final  Blood Culture ID Panel (Reflexed)     Status: Abnormal   Collection Time: 06/18/24  8:24 PM  Result Value Ref Range Status   Enterococcus faecalis NOT DETECTED NOT DETECTED Final   Enterococcus Faecium NOT DETECTED NOT DETECTED Final   Listeria monocytogenes NOT DETECTED NOT DETECTED Final   Staphylococcus species DETECTED (A) NOT DETECTED Final    Comment: CRITICAL RESULT CALLED TO, READ BACK BY AND VERIFIED WITH: ALEX CHAPPELL PHARMD 1411 06/19/24 HNM    Staphylococcus aureus (BCID) NOT DETECTED NOT DETECTED Final   Staphylococcus epidermidis NOT DETECTED NOT DETECTED Final   Staphylococcus lugdunensis NOT DETECTED NOT DETECTED Final   Streptococcus species NOT DETECTED NOT DETECTED Final   Streptococcus agalactiae NOT DETECTED NOT DETECTED Final   Streptococcus pneumoniae NOT DETECTED NOT DETECTED Final   Streptococcus pyogenes NOT DETECTED NOT DETECTED Final   A.calcoaceticus-baumannii NOT DETECTED NOT DETECTED Final   Bacteroides fragilis NOT DETECTED NOT DETECTED Final   Enterobacterales NOT DETECTED NOT DETECTED Final   Enterobacter cloacae complex NOT DETECTED NOT DETECTED Final   Escherichia coli NOT DETECTED NOT DETECTED Final   Klebsiella aerogenes NOT DETECTED NOT DETECTED Final   Klebsiella oxytoca NOT DETECTED NOT DETECTED Final   Klebsiella pneumoniae NOT DETECTED NOT DETECTED Final   Proteus species NOT DETECTED NOT DETECTED Final   Salmonella species NOT DETECTED NOT DETECTED Final   Serratia marcescens NOT DETECTED NOT DETECTED Final   Haemophilus influenzae NOT DETECTED NOT DETECTED Final   Neisseria meningitidis NOT DETECTED NOT DETECTED Final   Pseudomonas aeruginosa NOT DETECTED NOT DETECTED Final   Stenotrophomonas maltophilia NOT DETECTED NOT DETECTED Final   Candida albicans NOT DETECTED NOT DETECTED Final   Candida auris NOT DETECTED NOT DETECTED Final   Candida glabrata NOT  DETECTED NOT DETECTED Final   Candida krusei NOT DETECTED NOT DETECTED Final   Candida parapsilosis NOT DETECTED NOT DETECTED Final   Candida tropicalis NOT DETECTED NOT DETECTED Final   Cryptococcus neoformans/gattii NOT DETECTED NOT DETECTED Final    Comment: Performed at [facility], [address] [place], [place] [address]  Blood culture (routine x 2)     Status: None   Collection Time: 06/18/24 10:13 PM   Specimen: BLOOD  Result Value Ref Range Status   Specimen Description BLOOD BLOOD LEFT ARM  Final   Special Requests   Final    BOTTLES DRAWN AEROBIC AND ANAEROBIC Blood Culture results may not be optimal due to an inadequate volume of blood received in culture bottles   Culture   Final    NO GROWTH 5 DAYS Performed at [facility], [address] [place], [place] [address]    Report Status 06/23/2024 FINAL  Final  Respiratory (~20 pathogens) panel by PCR     Status: Abnormal   Collection Time: 06/19/24  4:59 PM   Specimen: Nasopharyngeal Swab; Respiratory  Result Value Ref Range Status   Adenovirus NOT DETECTED NOT DETECTED Final   Coronavirus 229E NOT DETECTED NOT DETECTED Final    Comment: (NOTE) The Coronavirus on the Respiratory Panel, DOES NOT test for the novel  Coronavirus (2019 nCoV)    Coronavirus HKU1 NOT DETECTED NOT DETECTED Final   Coronavirus NL63 NOT DETECTED NOT DETECTED Final   Coronavirus OC43 NOT DETECTED NOT DETECTED Final   Metapneumovirus NOT DETECTED NOT DETECTED Final   Rhinovirus / Enterovirus DETECTED (A) NOT DETECTED Final   Influenza A NOT DETECTED NOT DETECTED Final   Influenza B NOT DETECTED NOT DETECTED Final   Parainfluenza Virus 1 NOT DETECTED NOT DETECTED Final   Parainfluenza Virus 2 NOT DETECTED NOT DETECTED Final   Parainfluenza Virus 3 NOT DETECTED NOT DETECTED Final   Parainfluenza Virus 4 NOT DETECTED NOT DETECTED Final   Respiratory Syncytial Virus NOT DETECTED NOT DETECTED Final   Bordetella pertussis NOT  DETECTED NOT DETECTED Final   Bordetella Parapertussis NOT DETECTED NOT DETECTED Final   Chlamydophila pneumoniae NOT DETECTED NOT DETECTED Final   Mycoplasma pneumoniae NOT DETECTED NOT DETECTED Final    Comment: Performed at [facility], [address], [place], [place] [address]  MRSA Next Gen by PCR, Nasal     Status: None   Collection Time: 06/19/24 11:04 PM   Specimen: Nasal Mucosa; Nasal Swab  Result Value Ref Range Status   MRSA by PCR Next Gen NOT DETECTED NOT DETECTED Final    Comment: (NOTE) The GeneXpert MRSA Assay (FDA approved for NASAL specimens only), is one component of a comprehensive MRSA colonization surveillance program. It is not intended to diagnose MRSA infection nor to guide or monitor treatment for MRSA infections. Test performance is not FDA approved in patients less than 72 years old. Performed at [facility], [address] [place], [place] [address]     Labs: CBC: Recent Labs  Lab 06/20/24 0422 06/21/24 0336 06/22/24 0440  WBC 14.9* 13.5* 9.0  HGB 11.3* 10.5* 11.0*  HCT 34.0* 31.1* 34.4*  MCV 88.5 88.1 89.4  PLT 202 205 217   Basic Metabolic Panel: Recent Labs  Lab 06/20/24 0422 06/21/24 0336 06/22/24 0440  NA 140 142 140  K 4.2 4.1 4.4  CL 103 103 101  CO2 26 29 29   GLUCOSE 135* 83 115*  BUN 17 19 22   CREATININE 0.47 0.45 0.46  CALCIUM 9.0 8.7* 9.0  MG 2.1 2.2  --   PHOS 2.0* 2.9 2.8   Liver Function Tests: Recent Labs  Lab 06/20/24 0422 06/21/24 0336 06/22/24 0440  ALBUMIN 4.0 3.6 3.9   CBG: Recent Labs  Lab 06/19/24 2206  GLUCAP 152*    Discharge time spent: greater than 30 minutes.  Signed: Laree Lock, MD [facility] 06/26/2024

## 2024-06-26 NOTE — TOC Progression Note (Signed)
 Transition of Care [facility]) - Progression Note    Patient Details  Name: Gina Bates MRN: [id number] Date of Birth: 09-Jul-1961  Transition of Care [facility]) CM/SW Contact  Corrie JINNY Ruts, LCSW Phone Number: 06/26/2024, 3:13 PM  Clinical Narrative:    Chart reviewed. Received a secure chat from MD. Patient is needing oxygen to D/C home. SW has been on the phone for 45 minutes waiting to someone to answer. SW has informed MD. Patient will need to D/C home with Oxygen machine unless family get one from home, if portable.      Barriers to Discharge: Continued Medical Work up               Expected Discharge Plan and Services         Expected Discharge Date: 06/26/24                                     Social Drivers of Health (SDOH) Interventions SDOH Screenings   Food Insecurity: No Food Insecurity (06/20/2024)  Housing: Low Risk  (06/20/2024)  Transportation Needs: No Transportation Needs (06/20/2024)  Utilities: Not At Risk (06/20/2024)  Financial Resource Strain: Low Risk (05/28/2024)   Received from [facility]  Tobacco Use: Medium Risk (06/18/2024)    Readmission Risk Interventions     No data to display

## 2024-08-04 ENCOUNTER — Encounter: Payer: Self-pay | Admitting: Student in an Organized Health Care Education/Training Program

## 2024-08-04 ENCOUNTER — Ambulatory Visit: Admitting: Student in an Organized Health Care Education/Training Program

## 2024-08-04 VITALS — BP 122/70 | HR 106 | Temp 98.1°F | Ht 60.0 in | Wt 94.4 lb

## 2024-08-04 DIAGNOSIS — Z23 Encounter for immunization: Secondary | ICD-10-CM

## 2024-08-04 DIAGNOSIS — R634 Abnormal weight loss: Secondary | ICD-10-CM | POA: Diagnosis not present

## 2024-08-04 DIAGNOSIS — E46 Unspecified protein-calorie malnutrition: Secondary | ICD-10-CM

## 2024-08-04 DIAGNOSIS — J9611 Chronic respiratory failure with hypoxia: Secondary | ICD-10-CM

## 2024-08-04 DIAGNOSIS — Z87891 Personal history of nicotine dependence: Secondary | ICD-10-CM

## 2024-08-04 DIAGNOSIS — J439 Emphysema, unspecified: Secondary | ICD-10-CM | POA: Diagnosis not present

## 2024-08-04 DIAGNOSIS — J432 Centrilobular emphysema: Secondary | ICD-10-CM

## 2024-08-04 MED ORDER — OHTUVAYRE 3 MG/2.5ML IN SUSP: 3.0000 mg | Freq: Two times a day (BID) | RESPIRATORY_TRACT | Status: AC

## 2024-08-04 MED ORDER — FLUTICASONE-SALMETEROL 250-50 MCG/ACT IN AEPB: 1.0000 | INHALATION_SPRAY | Freq: Two times a day (BID) | RESPIRATORY_TRACT | 12 refills | Status: AC

## 2024-08-04 MED ORDER — SPIRIVA RESPIMAT 2.5 MCG/ACT IN AERS: 2.0000 | INHALATION_SPRAY | Freq: Every day | RESPIRATORY_TRACT | 12 refills | Status: AC

## 2024-08-04 NOTE — Progress Notes (Signed)
 "  Synopsis: Referred in for COPD by Kerry Rollene SAUNDERS, MD Assessment & Plan  #COPD with severe emphysema  Chronic COPD with severe emphysema noted on imaging. No PFT's in the record and we will obtain them to assess spirometry for degree of obstruction and lung volumes for air trapping. Her disease has been exacerbated by recent infection with rhinovirus and possible S. Pneumo. Long-term tobacco use contributing to disease, but degree of emphysema appears out of proportion to reported history of smoking. Will obtain alpha-1 levels to assess for deficiency. She is up to date on most of her vaccinations (PCV, COVID).  Current treatment includes LAMA and LABA ICS. Will switch her Spiriva  to a respimat delivery device for easy of use. Will continue Wixela for ICS/LABA effect. Will add nebulized ensifentrine  for symptomatic relief and have ordered pulmonary rehabilitation. Finally, given history of smoking, will refer to our lung cancer screening program.  - Switched Spiriva  to Spiriva  Respimat. - Added Ohtuvayre  nebulizer. - Ordered pulmonary rehabilitation. - Ordered alpha-1 antitrypsin deficiency test. - Ordered repeat pulmonary function test in three months. - Alpha-1-Antitrypsin Phenotyp; Future - Pulmonary Function Test; Future - Tiotropium Bromide (SPIRIVA  RESPIMAT) 2.5 MCG/ACT AERS; Inhale 2 puffs into the lungs daily.  Dispense: 4 g; Refill: 12 - Ensifentrine  (OHTUVAYRE ) 3 MG/2.5ML SUSP; Inhale 3 mg into the lungs 2 (two) times daily. - fluticasone -salmeterol (ADVAIR) 250-50 MCG/ACT AEPB; Inhale 1 puff into the lungs 2 (two) times daily.  Dispense: 60 each; Refill: 12 - AMB referral to pulmonary rehabilitation - Ambulatory Referral for Lung Cancer Screening - Flu shot today  #Chronic Hypoxic Respiratory Failure  This is secondary to COPD and severe emphysema. Will assess DLCO on PFT's. Previously intermittent use but now more chronic. Discussed portable oxygen concentrator. Will  attempt to wean her off oxygen with trending pulse oximetry on her follow up visit.  - Continue long-term oxygen therapy.  #Malnutrition with recent weight loss  Recent weight loss of 12 pounds during hospitalization. Working on regaining weight through diet.  - Encouraged high-protein diet and protein shakes.  #History of tobacco use  Long-term tobacco use from age 38 to 89, contributing to severe emphysema. Increased risk for lung cancer due to smoking history. Recent CT scan negative. Discussed importance of annual CT scan.  - Referred to lung cancer screening program for annual CT scan.    Return in about 3 months (around 11/02/2024).  Belva November, MD [facility]  I spent 45 minutes caring for this patient today, including preparing to see the patient, obtaining a medical history , reviewing a separately obtained history, performing a medically appropriate examination and/or evaluation, counseling and educating the patient/family/caregiver, ordering medications, tests, or procedures, documenting clinical information in the electronic health record, and independently interpreting results (not separately reported/billed) and communicating results to the patient/family/caregiver  End of visit medications:  Meds ordered this encounter  Medications   Tiotropium Bromide (SPIRIVA  RESPIMAT) 2.5 MCG/ACT AERS    Sig: Inhale 2 puffs into the lungs daily.    Dispense:  4 g    Refill:  12   Ensifentrine  (OHTUVAYRE ) 3 MG/2.5ML SUSP    Sig: Inhale 3 mg into the lungs 2 (two) times daily.   fluticasone -salmeterol (ADVAIR) 250-50 MCG/ACT AEPB    Sig: Inhale 1 puff into the lungs 2 (two) times daily.    Dispense:  60 each    Refill:  12    Current Medications[1]   Subjective:   PATIENT ID: Gina Bates GENDER:  female DOB: February 05, 1961, MRN: [id number]  Chief Complaint  Patient presents with   Consult    Occasional shortness of breath on exertion.      HPI  Discussed the use of AI scribe software for clinical note transcription with the patient, who gave verbal consent to proceed.  History of Present Illness  Gina Bates is a 64 year old female with COPD and severe emphysema who presents for post hospital discharge follow-up after a COPD exacerbation.  She was recently hospitalized for a COPD exacerbation secondary to rhinovirus and possibly Streptococcus pneumoniae, confirmed by a positive urinary antigen. During her hospital stay, she was treated with steroids, antibiotics, and nebulizers, and required BiPAP support. She was later transitioned to high flow nasal cannula before being discharged on oxygen via nasal cannula.  She has a known history of COPD, managed with LAMA (Spiriva  handihaler) and LABA/ICS (Wixela). Prior to this hospitalization, she was hospitalized three years ago for the flu but did not require BiPAP for as long as this recent episode.  Since discharge, she has been using home oxygen more frequently. Previously, she used oxygen as needed, approximately once a month, but now requires it consistently. She experiences increased shortness of breath and difficulty with physical activities such as climbing stairs, which she could do with effort before hospitalization. She has not attempted to walk to her mailbox since returning home but can ambulate within her house with the aid of oxygen.  She reports a history of occasional wheezing and productive cough, with no specific triggers identified. The wheezing occurs during the day and does not disturb her sleep. No chronic cough but notes coughing up sputum occasionally.  Her current medications include Spiriva  handihaler and Wixela, which she has been using for years. She notes that Wixela made a noticeable difference in her symptoms, whereas Spiriva  did not seem as effective.  Her social history includes a significant smoking history, having smoked from age 74 to 16,  approximately half a pack to a pack per day. 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 She has a family history of lung cancer; her father, a forensic scientist, had lung cancer.  She has lost 12 pounds during her hospital stay and is trying to regain weight by eating protein-rich foods and drinking protein shakes. She is also participating in physical therapy at home to improve her strength.   Ancillary information including prior medications, full medical/surgical/family/social histories, and PFTs (when available) are listed below and have been reviewed.   Review of Systems  Constitutional:  Positive for weight loss. Negative for chills, fever and malaise/fatigue.  Respiratory:  Positive for cough, shortness of breath and wheezing. Negative for hemoptysis and sputum production.   Cardiovascular:  Negative for chest pain.  All other systems reviewed and are negative.    Objective:   Vitals:   08/04/24 1427  BP: 122/70  Pulse: (!) 106  Temp: 98.1 F (36.7 C)  TempSrc: Temporal  SpO2: 98%  Weight: 94 lb 6.4 oz (42.8 kg)  Height: 5' (1.524 m)   98% on 2 LPM  BMI Readings from Last 3 Encounters:  08/04/24 18.44 kg/m  06/26/24 17.91 kg/m  06/24/21 20.22 kg/m   Wt Readings from Last 3 Encounters:  08/04/24 94 lb 6.4 oz (42.8 kg)  06/26/24 91 lb 11.4 oz (41.6 kg)  06/24/21 107 lb (48.5 kg)    Physical Exam Constitutional:      Appearance: Normal appearance. She is ill-appearing.  Cardiovascular:  Rate and Rhythm: Normal rate and regular rhythm.     Pulses: Normal pulses.     Heart sounds: Normal heart sounds.  Pulmonary:     Effort: Pulmonary effort is normal. No respiratory distress.     Breath sounds: No wheezing or rales.     Comments: Decreased air entry bilaterally Neurological:     Mental Status: She is alert.       Ancillary Information    Past Medical History:  Diagnosis Date   COPD (chronic obstructive pulmonary disease)  (HCC)      No family history on file.   No past surgical history on file.  Social History   Socioeconomic History   Marital status: Married    Spouse name: Not on file   Number of children: Not on file   Years of education: Not on file   Highest education level: Not on file  Occupational History   Not on file  Tobacco Use   Smoking status: Former    Types: Cigarettes   Smokeless tobacco: Never  Substance and Sexual Activity   Alcohol use: Not Currently   Drug use: Never   Sexual activity: Not on file  Other Topics Concern   Not on file  Social History Narrative   Not on file   Social Drivers of Health   Tobacco Use: Medium Risk (08/04/2024)   Patient History    Smoking Tobacco Use: Former    Smokeless Tobacco Use: Never    Passive Exposure: Not on file  Financial Resource Strain: Low Risk (05/28/2024)   Received from [facility]   Overall Financial Resource Strain (CARDIA)    How hard is it for you to pay for the very basics like food, housing, medical care, and heating?: Not hard at all  Food Insecurity: No Food Insecurity (06/20/2024)   Epic    Worried About Radiation Protection Practitioner of Food in the Last Year: Never true    Ran Out of Food in the Last Year: Never true  Transportation Needs: No Transportation Needs (06/20/2024)   Epic    Lack of Transportation (Medical): No    Lack of Transportation (Non-Medical): No  Physical Activity: Not on file  Stress: Not on file  Social Connections: Not on file  Intimate Partner Violence: Not At Risk (06/20/2024)   Epic    Fear of Current or Ex-Partner: No    Emotionally Abused: No    Physically Abused: No    Sexually Abused: No  Depression (PHQ2-9): Not on file  Alcohol Screen: Not on file  Housing: Low Risk (06/20/2024)   Epic    Unable to Pay for Housing in the Last Year: No    Number of Times Moved in the Last Year: 0    Homeless in the Last Year: No  Utilities: Not At Risk (06/20/2024)   Epic    Threatened with loss  of utilities: No  Health Literacy: Not on file     Allergies[2]   CBC    Component Value Date/Time   WBC 9.0 06/22/2024 0440   RBC 3.85 (L) 06/22/2024 0440   HGB 11.0 (L) 06/22/2024 0440   HCT 34.4 (L) 06/22/2024 0440   PLT 217 06/22/2024 0440   MCV 89.4 06/22/2024 0440   MCH 28.6 06/22/2024 0440   MCHC 32.0 06/22/2024 0440   RDW 13.4 06/22/2024 0440   LYMPHSABS 4.5 (H) 06/18/2024 2024   MONOABS 1.6 (H) 06/18/2024 2024   EOSABS 0.1 06/18/2024 2024   BASOSABS  0.1 06/18/2024 2024    Pulmonary Functions Testing Results:     No data to display          Outpatient Medications Prior to Visit  Medication Sig Dispense Refill   albuterol  (PROVENTIL ) (2.5 MG/3ML) 0.083% nebulizer solution Take 3 mLs (2.5 mg total) by nebulization every 6 (six) hours as needed for wheezing or shortness of breath. 75 mL 0   albuterol  (VENTOLIN  HFA) 108 (90 Base) MCG/ACT inhaler Inhale 2 puffs into the lungs every 6 (six) hours as needed for shortness of breath. 18 g 0   aspirin  EC 81 MG tablet Take 1 tablet (81 mg total) by mouth daily. Swallow whole. 30 tablet 3   escitalopram  (LEXAPRO ) 10 MG tablet Take 1 tablet (10 mg total) by mouth daily. 30 tablet 3   hydrOXYzine  (ATARAX ) 10 MG tablet Take 1 tablet (10 mg total) by mouth 3 (three) times daily as needed for anxiety. 30 tablet 0   Multiple Vitamin (MULTIVITAMIN WITH MINERALS) TABS tablet Take 1 tablet by mouth daily.     simvastatin  (ZOCOR ) 40 MG tablet Take 40 mg by mouth at bedtime.     fluticasone -salmeterol (ADVAIR) 250-50 MCG/ACT AEPB Inhale 1 puff into the lungs 2 (two) times daily.     SPIRIVA  HANDIHALER 18 MCG inhalation capsule Place 1 capsule into inhaler and inhale in the morning.     Tiotropium Bromide (SPIRIVA  HANDIHALER) 18 MCG CAPS Place 18 mcg into inhaler and inhale.     No facility-administered medications prior to visit.      [1]  Current Outpatient Medications:    albuterol  (PROVENTIL ) (2.5 MG/3ML) 0.083% nebulizer  solution, Take 3 mLs (2.5 mg total) by nebulization every 6 (six) hours as needed for wheezing or shortness of breath., Disp: 75 mL, Rfl: 0   albuterol  (VENTOLIN  HFA) 108 (90 Base) MCG/ACT inhaler, Inhale 2 puffs into the lungs every 6 (six) hours as needed for shortness of breath., Disp: 18 g, Rfl: 0   aspirin  EC 81 MG tablet, Take 1 tablet (81 mg total) by mouth daily. Swallow whole., Disp: 30 tablet, Rfl: 3   Ensifentrine  (OHTUVAYRE ) 3 MG/2.5ML SUSP, Inhale 3 mg into the lungs 2 (two) times daily., Disp: , Rfl:    escitalopram  (LEXAPRO ) 10 MG tablet, Take 1 tablet (10 mg total) by mouth daily., Disp: 30 tablet, Rfl: 3   hydrOXYzine  (ATARAX ) 10 MG tablet, Take 1 tablet (10 mg total) by mouth 3 (three) times daily as needed for anxiety., Disp: 30 tablet, Rfl: 0   Multiple Vitamin (MULTIVITAMIN WITH MINERALS) TABS tablet, Take 1 tablet by mouth daily., Disp: , Rfl:    simvastatin  (ZOCOR ) 40 MG tablet, Take 40 mg by mouth at bedtime., Disp: , Rfl:    Tiotropium Bromide (SPIRIVA  RESPIMAT) 2.5 MCG/ACT AERS, Inhale 2 puffs into the lungs daily., Disp: 4 g, Rfl: 12   fluticasone -salmeterol (ADVAIR) 250-50 MCG/ACT AEPB, Inhale 1 puff into the lungs 2 (two) times daily., Disp: 60 each, Rfl: 12 [2] No Known Allergies  "

## 2024-08-04 NOTE — Patient Instructions (Signed)
" °  VISIT SUMMARY: Today, we discussed your recent hospitalization for a COPD exacerbation and your ongoing symptoms. We reviewed your current medications and made some adjustments to better manage your condition. We also talked about your increased need for oxygen, your recent weight loss, and the importance of pulmonary rehabilitation and lung cancer screening.  YOUR PLAN: -COPD WITH SEVERE EMPHYSEMA: Chronic obstructive pulmonary disease (COPD) with severe emphysema is a long-term lung condition that makes it hard to breathe. We switched your Spiriva  to Spiriva  Respimat for easier use and added Ohtuvayre  nebulizer. We also ordered pulmonary rehabilitation and tests for alpha-1 antitrypsin deficiency and a repeat pulmonary function test in three months.  -RECENT COPD EXACERBATION SECONDARY TO VIRAL AND PNEUMOCOCCAL INFECTION: Your recent COPD exacerbation was caused by a viral and bacterial infection, which required hospitalization. Although your symptoms have improved, you still experience weakness and shortness of breath. Continue with a high-protein diet and physical therapy at home to help with recovery.  -LONG-TERM OXYGEN THERAPY DEPENDENCE: You now need to use oxygen more frequently than before. We discussed the possibility of getting a portable oxygen concentrator, although insurance coverage is uncertain. Continue with your long-term oxygen therapy.  -MALNUTRITION WITH RECENT WEIGHT LOSS: You lost 12 pounds during your hospital stay. To help regain weight, continue eating a high-protein diet and drinking protein shakes.  -PULMONARY REHABILITATION REFERRAL: Pulmonary rehabilitation can help improve your breathing, reduce shortness of breath, and increase your strength. We have ordered this for you.  -HISTORY OF TOBACCO USE: Your long-term smoking history has contributed to your severe emphysema. It is important to avoid smoking to prevent further damage.  -LUNG CANCER SCREENING: Due to your  smoking history, you are at increased risk for lung cancer. Your recent CT scan was negative, but it is important to have an annual CT scan. We have referred you to a lung cancer screening program.  INSTRUCTIONS: Please follow up in three months for a repeat pulmonary function test. Continue with your high-protein diet, physical therapy, and long-term oxygen therapy. Attend your pulmonary rehabilitation sessions and follow up with the lung cancer screening program for your annual CT scan.      Contains text generated by [employer].  "

## 2024-08-08 ENCOUNTER — Telehealth: Payer: Self-pay | Admitting: Student in an Organized Health Care Education/Training Program

## 2024-08-08 ENCOUNTER — Telehealth: Payer: Self-pay

## 2024-08-08 NOTE — Telephone Encounter (Signed)
 Form has been placed in your folder.

## 2024-08-08 NOTE — Telephone Encounter (Signed)
 Dropped off handicap placard form.

## 2024-08-08 NOTE — Telephone Encounter (Signed)
 Received Ohtuvayre  new start paperwork. Completed form and faxed with clinicals and insurance card copy to [facility]   Phone#: [phone] Fax#: [phone]

## 2024-08-09 NOTE — Telephone Encounter (Signed)
 Received fax from [employer] confirming recept of enrollment form  Patient ID: [id number]

## 2024-08-12 NOTE — Telephone Encounter (Signed)
 I have notified the patient and placed the form in the mail.  Nothing further needed.

## 2024-08-20 ENCOUNTER — Encounter: Payer: Self-pay | Admitting: Student in an Organized Health Care Education/Training Program

## 2024-08-20 NOTE — Telephone Encounter (Signed)
 Received fax from [employer] with summary of benefits. Referral form for Ohtuvayre  received. Rx will be triaged to [facility] (phone: [phone]). Once benefits investigation completed, pharmacy will reach out the patient to schedule shipment. If medication is unaffordable, patient will need to express financial hardship to be referred back to [facility] for patient assistance program pre-screening.   Patient ID: [id number] Pharmacy phone: [facility] (phone: [phone]) [facility] [facility] Phone#: [phone]

## 2024-08-20 NOTE — Telephone Encounter (Signed)
 Submitted a Prior Authorization request to [facility] for OHTUVAYRE  via CoverMyMeds. Will update once we receive a response.  Key: [id number]

## 2024-08-21 ENCOUNTER — Encounter: Payer: Self-pay | Admitting: Student in an Organized Health Care Education/Training Program

## 2024-08-25 NOTE — Telephone Encounter (Signed)
 Received a fax regarding Prior Authorization from [facility] for OHTUVAYRE . Authorization has been DENIED because: Your plan only covers this drug when you meet one of these options: A) You have tried other drugs your plan covers (preferred drugs), and they did not work well for you, or B) Your doctor gives us  a medical reason you cannot take those other drugs. For your plan, you may need to try up to three preferred drugs. We have denied your request because you do not meet any of these conditions. We reviewed the information we had. Your request has been denied. Your doctor can send us  any new or missing information for us  to review. The preferred drugs for your plan are: arformoterol , budesonideformoterol, fluticasone -salmeterol (except certain NDCs), formoterol inhalation solution, Breyna, Wixela Inhub, ANORO ELLIPTA, BREO ELLIPTA (except certain NDCs), BREZTRI AEROSPHERE, SEREVENT, SPIRIVA , STIOLTO RESPIMAT, STRIVERDI [facility] is the pharmacy benefit manager administering the prescription drug benefits on behalf of the health plan or plan sponsor. This document contains references to brand-name prescription drugs that are trademarks or registered trademarks of pharmaceutical manufacturers not affiliated with [facility] . [id number] TDD/TTY: [phone] RESPIMAT, TRELEGY ELLIPTA, YUPELRI. (Requirement: 3 in a class with 3 or more alternatives, 2 in a class with 2 alternatives, or 1 in a class with only 1 alternative.).  Phone# [phone]  Submitted a Prior Authorization request to [facility] for OHTUVAYRE  via CoverMyMeds. Will update once we receive a response.  Key: [id number]

## 2024-08-26 ENCOUNTER — Other Ambulatory Visit (HOSPITAL_COMMUNITY): Payer: Self-pay

## 2024-08-26 NOTE — Telephone Encounter (Signed)
 Received notification from [facility] regarding a prior authorization for OHTUVAYRE . Authorization has been APPROVED from 08/25/24 to 08/26/25. Approval letter sent to scan center.  Per test claim, copay for 30 days supply is $1759.69  Patient can fill through [facility]: [phone]  Authorization # [id number] Phone # [phone]  Sent message to pt to have [facility] and they should enroll her in copay card

## 2024-11-26 ENCOUNTER — Ambulatory Visit: Admitting: Student in an Organized Health Care Education/Training Program

## 2024-11-26 ENCOUNTER — Encounter
